# Patient Record
Sex: Male | Born: 1955 | Race: White | Hispanic: No | Marital: Married | Smoking: Never smoker
Health system: Southern US, Community
[De-identification: ages and names within clinical notes are randomized; demographics above are authoritative.]

## PROBLEM LIST (undated history)

## (undated) DIAGNOSIS — E785 Hyperlipidemia, unspecified: Secondary | ICD-10-CM

## (undated) DIAGNOSIS — I82409 Acute embolism and thrombosis of unspecified deep veins of unspecified lower extremity: Secondary | ICD-10-CM

## (undated) DIAGNOSIS — G4730 Sleep apnea, unspecified: Secondary | ICD-10-CM

## (undated) DIAGNOSIS — Z86718 Personal history of other venous thrombosis and embolism: Secondary | ICD-10-CM

## (undated) DIAGNOSIS — K219 Gastro-esophageal reflux disease without esophagitis: Secondary | ICD-10-CM

## (undated) DIAGNOSIS — M1990 Unspecified osteoarthritis, unspecified site: Secondary | ICD-10-CM

## (undated) HISTORY — DX: Gastro-esophageal reflux disease without esophagitis: K21.9

## (undated) HISTORY — DX: Personal history of other venous thrombosis and embolism: Z86.718

## (undated) HISTORY — DX: Acute embolism and thrombosis of unspecified deep veins of unspecified lower extremity: I82.409

## (undated) HISTORY — DX: Hyperlipidemia, unspecified: E78.5

## (undated) HISTORY — PX: TONSILLECTOMY: SHX5217

## (undated) HISTORY — DX: Unspecified osteoarthritis, unspecified site: M19.90

---

## 2008-02-01 HISTORY — PX: COLONOSCOPY: SHX174

## 2008-03-18 ENCOUNTER — Ambulatory Visit: Payer: Self-pay | Admitting: Gastroenterology

## 2008-03-18 LAB — HM COLONOSCOPY: HM COLON: NORMAL

## 2009-12-03 ENCOUNTER — Ambulatory Visit: Payer: Self-pay | Admitting: Family Medicine

## 2009-12-10 ENCOUNTER — Ambulatory Visit: Payer: Self-pay | Admitting: Family Medicine

## 2011-02-01 DIAGNOSIS — I82409 Acute embolism and thrombosis of unspecified deep veins of unspecified lower extremity: Secondary | ICD-10-CM

## 2011-02-01 HISTORY — DX: Acute embolism and thrombosis of unspecified deep veins of unspecified lower extremity: I82.409

## 2011-03-15 ENCOUNTER — Emergency Department: Payer: Self-pay | Admitting: Emergency Medicine

## 2011-03-15 LAB — CBC
MCH: 29.6 pg (ref 26.0–34.0)
MCV: 88 fL (ref 80–100)
Platelet: 158 10*3/uL (ref 150–440)
RDW: 12.8 % (ref 11.5–14.5)

## 2011-03-15 LAB — BASIC METABOLIC PANEL
Anion Gap: 10 (ref 7–16)
BUN: 19 mg/dL — ABNORMAL HIGH (ref 7–18)
Calcium, Total: 9 mg/dL (ref 8.5–10.1)
Creatinine: 1.05 mg/dL (ref 0.60–1.30)
EGFR (African American): >60
EGFR (Non-African Amer.): >60
Glucose: 99 mg/dL (ref 65–99)
Osmolality: 285 (ref 275–301)
Sodium: 142 mmol/L (ref 136–145)

## 2011-03-15 LAB — PROTIME-INR: Prothrombin Time: 13.4 secs (ref 11.5–14.7)

## 2011-03-21 ENCOUNTER — Ambulatory Visit: Payer: Self-pay | Admitting: Internal Medicine

## 2011-04-07 ENCOUNTER — Ambulatory Visit: Payer: Self-pay | Admitting: Internal Medicine

## 2011-04-08 LAB — PROT IMMUNOELECTROPHORES(ARMC)

## 2011-05-02 ENCOUNTER — Ambulatory Visit: Payer: Self-pay | Admitting: Internal Medicine

## 2011-06-02 ENCOUNTER — Ambulatory Visit: Payer: Self-pay | Admitting: Internal Medicine

## 2011-06-02 LAB — CBC CANCER CENTER
Eosinophil #: 0.3 x10 3/mm (ref 0.0–0.7)
HGB: 14.6 g/dL (ref 13.0–18.0)
Lymphocyte #: 1.8 x10 3/mm (ref 1.0–3.6)
Lymphocyte %: 35.4 %
MCHC: 33.3 g/dL (ref 32.0–36.0)
MCV: 87 fL (ref 80–100)
Monocyte %: 10.5 %
Neutrophil #: 2.4 x10 3/mm (ref 1.4–6.5)
RBC: 5.02 10*6/uL (ref 4.40–5.90)
RDW: 12.8 % (ref 11.5–14.5)
WBC: 5 x10 3/mm (ref 3.8–10.6)

## 2011-07-02 ENCOUNTER — Ambulatory Visit: Payer: Self-pay | Admitting: Internal Medicine

## 2012-02-01 DIAGNOSIS — Z86718 Personal history of other venous thrombosis and embolism: Secondary | ICD-10-CM

## 2012-02-01 HISTORY — DX: Personal history of other venous thrombosis and embolism: Z86.718

## 2013-03-20 IMAGING — CT CT HEAD WITHOUT CONTRAST
1 series · 16 of 30 positions shown, 20 images · non-contrast
Comparison: none

REASON FOR EXAM: HA on Coumadin      CR  [id number]
COMMENTS:

PROCEDURE:     CT  - CT HEAD WITHOUT CONTRAST  - June 02, 2011 [DATE]
RESULT:     Comparison:  None
TECHNIQUE: Multiple axial images from the foramen magnum to the vertex were
obtained without IV contrast.

[Series 2: soft tissue · axial · 0.44mm/px · z∈[+34,+184]mm · 16 of 34 slices shown, 20 images]
[im 2/34  brain]
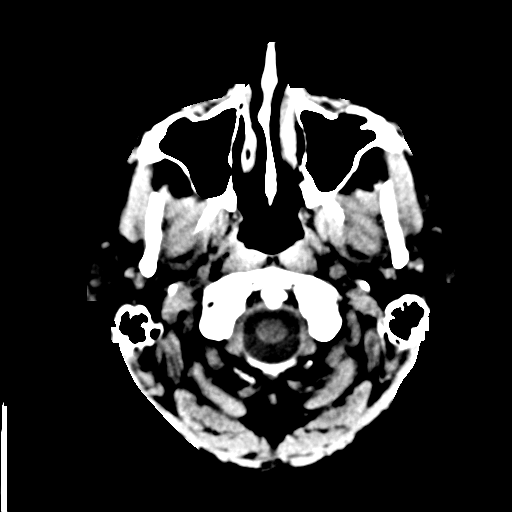
[im 2/34  bone]
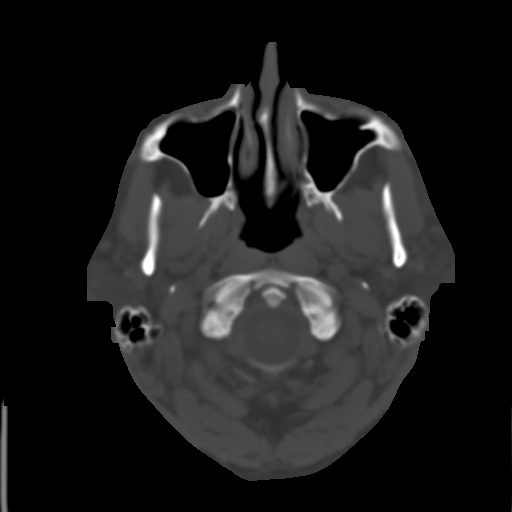
[im 4/34  brain]
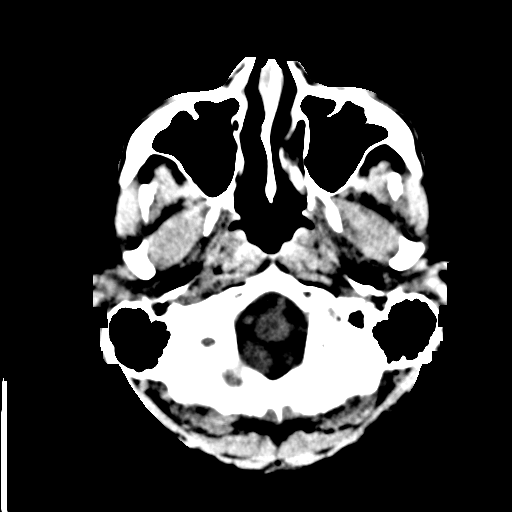
[im 6/34  brain]
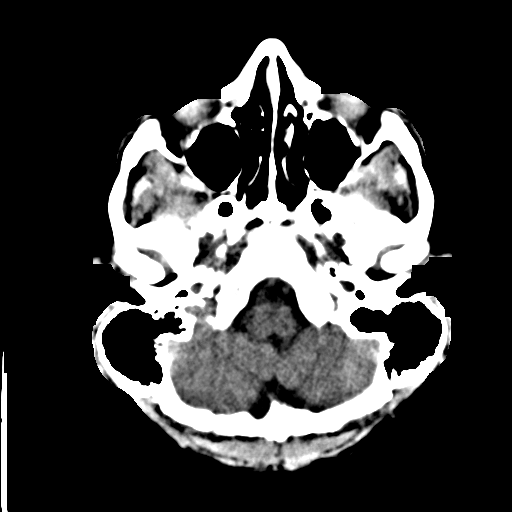
[im 8/34  brain]
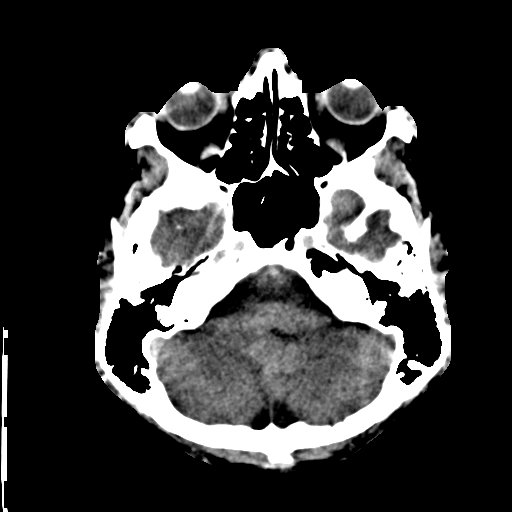
[im 10/34  brain]
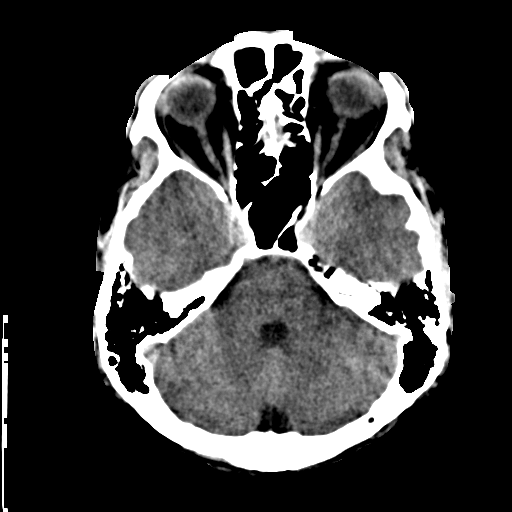
[im 10/34  bone]
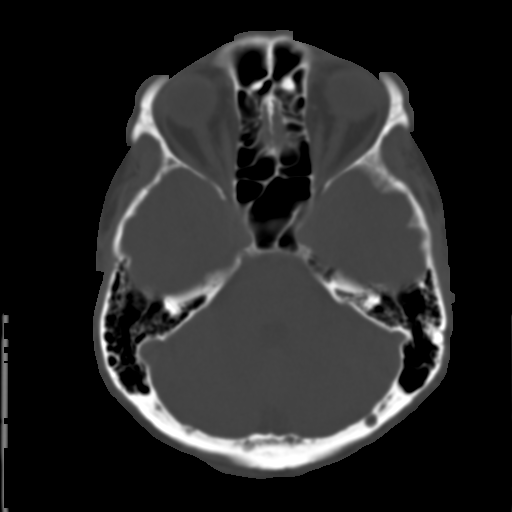
[im 12/34  brain]
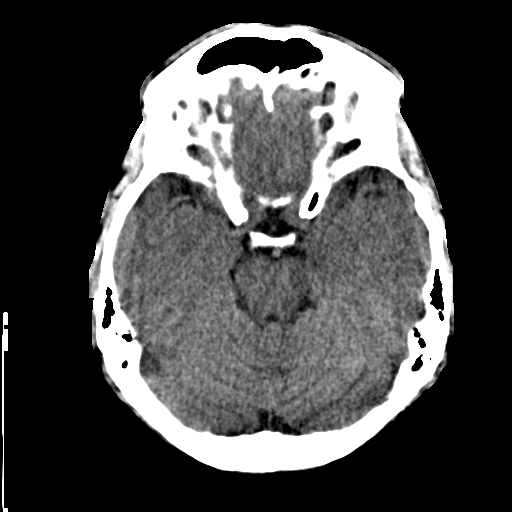
[im 14/34  brain]
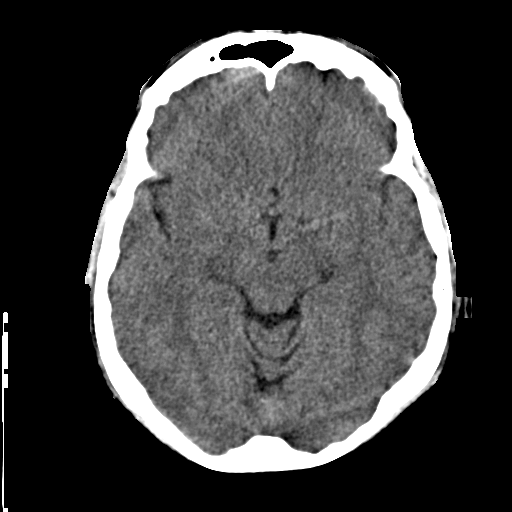
[im 16/34  brain]
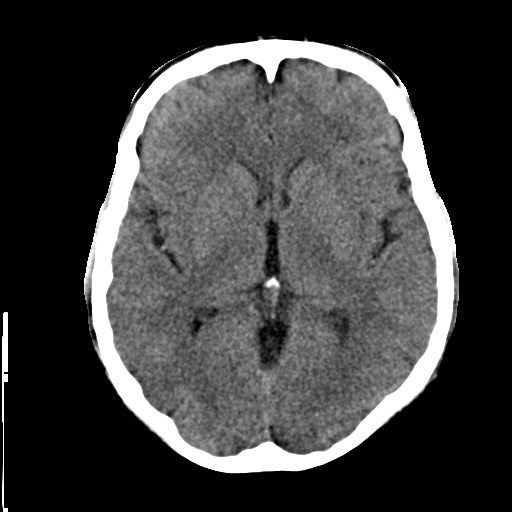
[im 18/34  brain]
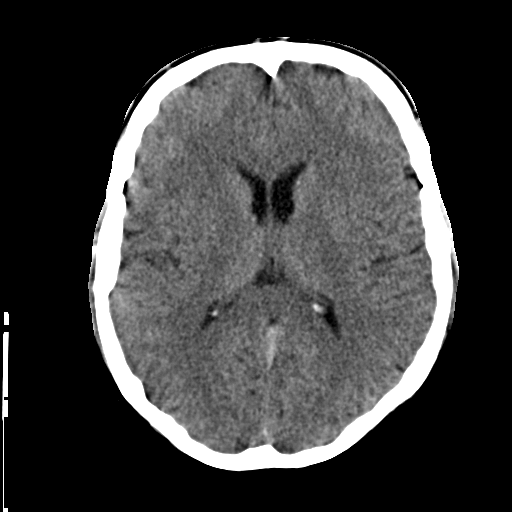
[im 18/34  bone]
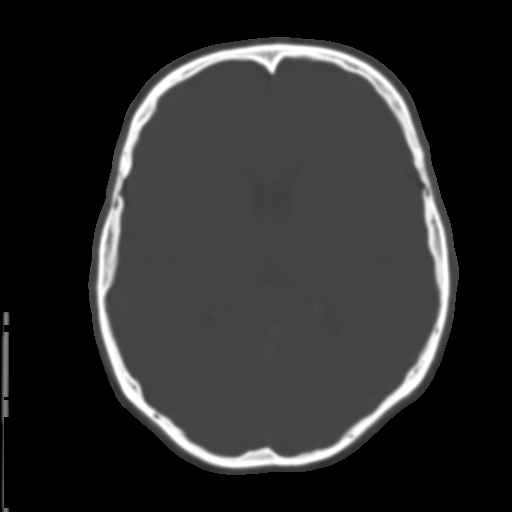
[im 20/34  brain]
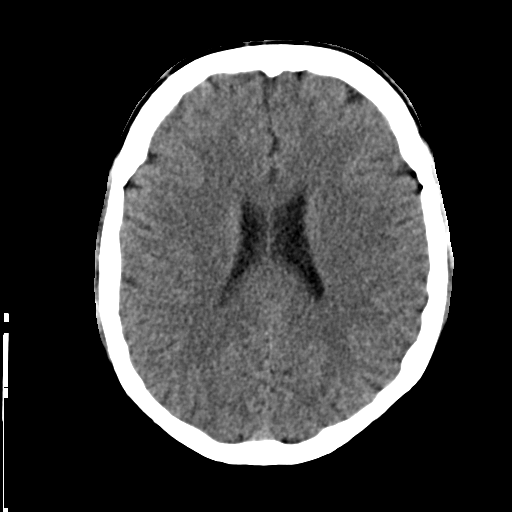
[im 22/34  brain]
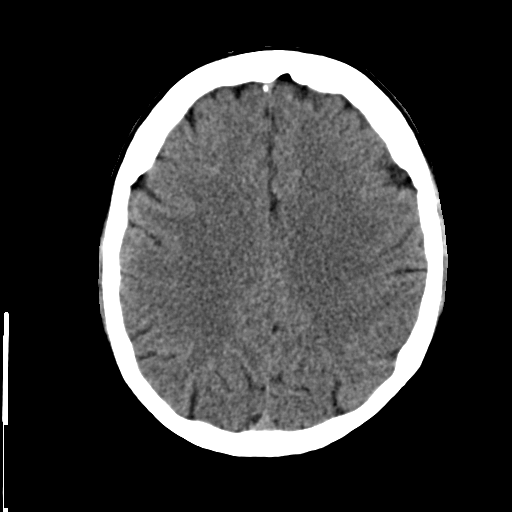
[im 24/34  brain]
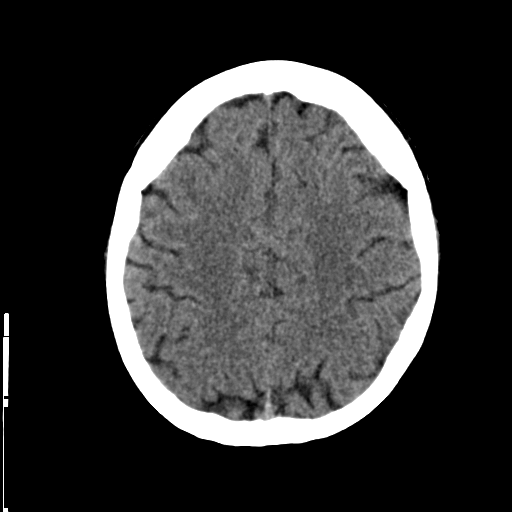
[im 26/34  brain]
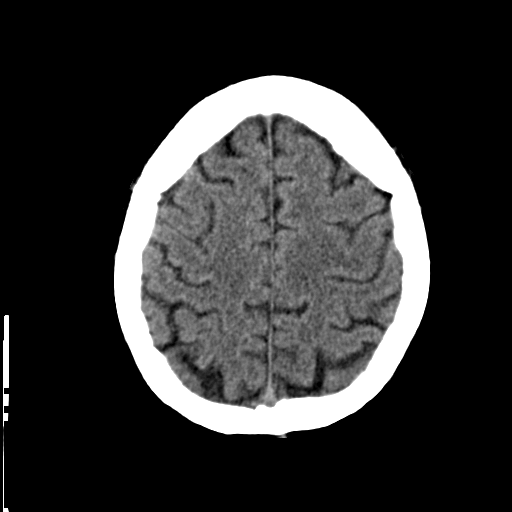
[im 26/34  bone]
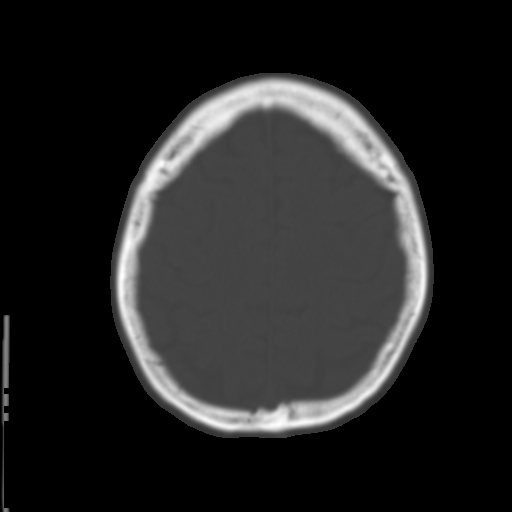
[im 28/34  brain]
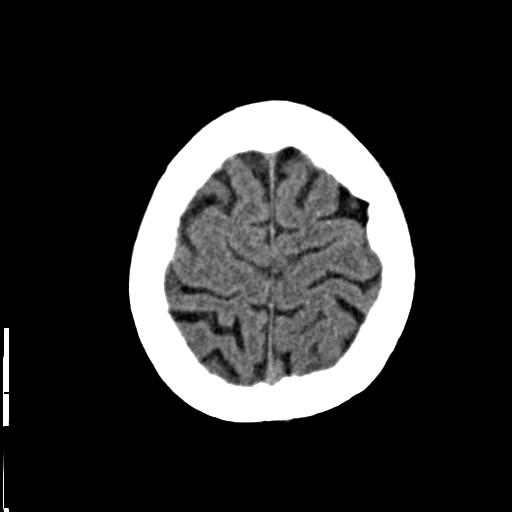
[im 30/34  brain]
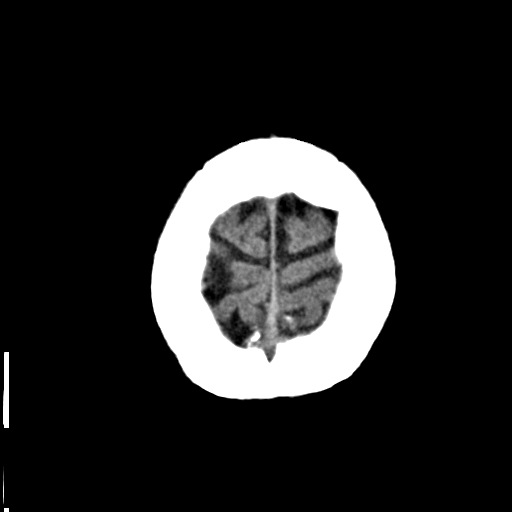
[im 32/34  brain]
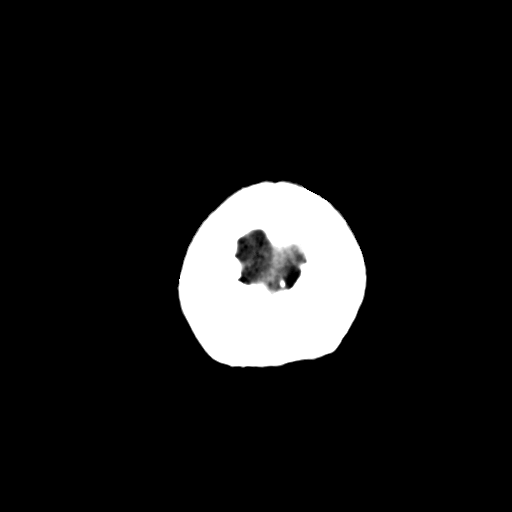

[16 of 30 positions shown; findings below may reference images not displayed]

FINDINGS: There is no evidence of mass effect, midline shift, or extra-axial fluid
collections.  There is no evidence of a space-occupying lesion or
intracranial hemorrhage. There is no evidence of a cortical-based area of
acute infarction.

The ventricles and sulci are appropriate for the patient's age. The basal
cisterns are patent.

Visualized portions of the orbits are unremarkable. The visualized portions
of the paranasal sinuses and mastoid air cells are unremarkable.

The osseous structures are unremarkable.
IMPRESSION: No acute intracranial process.

[REDACTED]

## 2013-04-29 ENCOUNTER — Ambulatory Visit: Payer: Self-pay | Admitting: Family Medicine

## 2013-09-03 ENCOUNTER — Ambulatory Visit: Payer: Self-pay | Admitting: Family Medicine

## 2013-09-03 ENCOUNTER — Inpatient Hospital Stay: Payer: Self-pay | Admitting: Internal Medicine

## 2013-09-03 LAB — BASIC METABOLIC PANEL
Anion Gap: 8 (ref 7–16)
BUN: 16 mg/dL (ref 7–18)
CALCIUM: 8.3 mg/dL — AB (ref 8.5–10.1)
Chloride: 104 mmol/L (ref 98–107)
Co2: 29 mmol/L (ref 21–32)
Creatinine: 1.07 mg/dL (ref 0.60–1.30)
EGFR (Non-African Amer.): >60
GLUCOSE: 123 mg/dL — AB (ref 65–99)
Osmolality: 284 (ref 275–301)
Potassium: 3.9 mmol/L (ref 3.5–5.1)
Sodium: 141 mmol/L (ref 136–145)

## 2013-09-03 LAB — CBC WITH DIFFERENTIAL/PLATELET
BASOS ABS: 0 10*3/uL (ref 0.0–0.1)
Basophil %: 0.3 %
EOS PCT: 3.1 %
Eosinophil #: 0.2 10*3/uL (ref 0.0–0.7)
HCT: 41.6 % (ref 40.0–52.0)
HGB: 14.1 g/dL (ref 13.0–18.0)
LYMPHS ABS: 1.6 10*3/uL (ref 1.0–3.6)
LYMPHS PCT: 25.2 %
MCH: 29.6 pg (ref 26.0–34.0)
MCHC: 33.8 g/dL (ref 32.0–36.0)
MCV: 88 fL (ref 80–100)
Monocyte #: 0.8 x10 3/mm (ref 0.2–1.0)
Monocyte %: 12.3 %
Neutrophil #: 3.8 10*3/uL (ref 1.4–6.5)
Neutrophil %: 59.1 %
Platelet: 201 10*3/uL (ref 150–440)
RBC: 4.74 10*6/uL (ref 4.40–5.90)
RDW: 13.3 % (ref 11.5–14.5)
WBC: 6.4 10*3/uL (ref 3.8–10.6)

## 2013-09-03 LAB — PROTIME-INR
INR: 2.4
PROTHROMBIN TIME: 25.3 s — AB (ref 11.5–14.7)

## 2013-09-03 LAB — APTT: ACTIVATED PTT: 55.5 s — AB (ref 23.6–35.9)

## 2013-09-04 ENCOUNTER — Ambulatory Visit: Payer: Self-pay | Admitting: Internal Medicine

## 2013-09-04 LAB — BASIC METABOLIC PANEL
Anion Gap: 7 (ref 7–16)
BUN: 14 mg/dL (ref 7–18)
CALCIUM: 8.4 mg/dL — AB (ref 8.5–10.1)
Chloride: 106 mmol/L (ref 98–107)
Co2: 27 mmol/L (ref 21–32)
Creatinine: 1.12 mg/dL (ref 0.60–1.30)
EGFR (Non-African Amer.): >60
Glucose: 92 mg/dL (ref 65–99)
OSMOLALITY: 280 (ref 275–301)
Potassium: 3.9 mmol/L (ref 3.5–5.1)
Sodium: 140 mmol/L (ref 136–145)

## 2013-09-04 LAB — CBC WITH DIFFERENTIAL/PLATELET
Basophil #: 0 10*3/uL (ref 0.0–0.1)
Basophil %: 0.6 %
Eosinophil #: 0.3 10*3/uL (ref 0.0–0.7)
Eosinophil %: 3.9 %
HCT: 41.3 % (ref 40.0–52.0)
HGB: 13.8 g/dL (ref 13.0–18.0)
LYMPHS PCT: 34.2 %
Lymphocyte #: 2.4 10*3/uL (ref 1.0–3.6)
MCH: 29.5 pg (ref 26.0–34.0)
MCHC: 33.4 g/dL (ref 32.0–36.0)
MCV: 88 fL (ref 80–100)
Monocyte #: 0.9 x10 3/mm (ref 0.2–1.0)
Monocyte %: 12.2 %
Neutrophil #: 3.5 10*3/uL (ref 1.4–6.5)
Neutrophil %: 49.1 %
Platelet: 208 10*3/uL (ref 150–440)
RBC: 4.68 10*6/uL (ref 4.40–5.90)
RDW: 13.4 % (ref 11.5–14.5)
WBC: 7.1 10*3/uL (ref 3.8–10.6)

## 2013-09-04 LAB — PROTIME-INR
INR: 2.1
PROTHROMBIN TIME: 23 s — AB (ref 11.5–14.7)

## 2013-09-04 LAB — HEPARIN LEVEL (UNFRACTIONATED): ANTI-XA(UNFRACTIONATED): 0.4 [IU]/mL (ref 0.30–0.70)

## 2013-09-18 ENCOUNTER — Ambulatory Visit: Payer: Self-pay | Admitting: Internal Medicine

## 2013-09-18 LAB — POTASSIUM: POTASSIUM: 4.2 mmol/L (ref 3.5–5.1)

## 2013-09-18 LAB — MAGNESIUM: MAGNESIUM: 2.1 mg/dL

## 2013-09-18 LAB — CALCIUM: Calcium, Total: 8.9 mg/dL (ref 8.5–10.1)

## 2013-10-01 ENCOUNTER — Ambulatory Visit: Payer: Self-pay | Admitting: Internal Medicine

## 2013-10-04 LAB — PSA: PSA: 0.7 ng/mL (ref 0.0–4.0)

## 2013-10-04 LAB — CEA: CEA: 2.1 ng/mL (ref 0.0–4.7)

## 2013-10-04 LAB — CANCER ANTIGEN 19-9: CA 19-9: 6 U/mL (ref 0–35)

## 2013-10-25 ENCOUNTER — Ambulatory Visit: Payer: Self-pay | Admitting: Family Medicine

## 2013-10-31 ENCOUNTER — Ambulatory Visit: Payer: Self-pay | Admitting: Internal Medicine

## 2013-12-18 LAB — BASIC METABOLIC PANEL
BUN: 18 mg/dL (ref 4–21)
CREATININE: 1.2 mg/dL (ref 0.6–1.3)
GLUCOSE: 96 mg/dL
POTASSIUM: 5.2 mmol/L (ref 3.4–5.3)
Sodium: 142 mmol/L (ref 137–147)

## 2013-12-18 LAB — PSA: PSA: 0.9

## 2013-12-18 LAB — HEPATIC FUNCTION PANEL
ALT: 21 U/L (ref 10–40)
AST: 9 U/L — AB (ref 14–40)

## 2013-12-18 LAB — LIPID PANEL
Cholesterol: 247 mg/dL — AB (ref 0–200)
HDL: 47 mg/dL (ref 35–70)
LDL Cholesterol: 140 mg/dL
Triglycerides: 298 mg/dL — AB (ref 40–160)

## 2013-12-18 LAB — TSH: TSH: 4.74 u[IU]/mL (ref 0.41–5.90)

## 2013-12-18 LAB — CBC AND DIFFERENTIAL
HCT: 46 % (ref 41–53)
HEMOGLOBIN: 15.5 g/dL (ref 13.5–17.5)
Platelets: 245 10*3/uL (ref 150–399)
WBC: 5.8 10*3/mL

## 2014-05-24 NOTE — H&P (Signed)
PATIENT NAME:  Terry Liu, KEMPFER MR#:  [id number] DATE OF BIRTH:  04/23/1955  DATE OF ADMISSION:  09/03/2013  PRIMARY CARE PHYSICIAN: Dr. Noberto Retort.  PRIMARY ONCOLOGIST:  Dr. Inez Pilgrim.   CHIEF COMPLAINT: Deep vein thrombosis.   HISTORY OF PRESENTING ILLNESS: A 59 year old male with history of deep vein thrombosis in the left leg who is on Coumadin since last 2 years since having deep vein thrombosis and following with Dr. Inez Pilgrim; 2 to 3 weeks ago his INR level was found to be 3.5 so his primary care doctor decreased his Coumadin from 9 mg daily to 7.5 mg daily. He called him to check his INR level which was 2.9; 2 to 3 days ago. He noticed that he has some swelling of his left calf with some redness around it for the last few days and so he talked to his primary care doctor about this. He referred him to have a Doppler study of  left lower extremity which was done today and reported to be positive for deep vein thrombosis. So, the patient's primary care physician spoke to Dr. Inez Pilgrim and they suggested that his INR level is expected, still he developed deep vein thrombosis and so which might be Coumadin failure and sent him in for direct admission to be started on hepatin IV drip and further management as per Dr. Inez Pilgrim.   REVIEW OF SYSTEMS:  CONSTITUTIONAL: Negative for fever, fatigue, weakness, pain or weight loss.  EYES: No blurring, double vision, discharge or redness.  EARS, NOSE, THROAT: No tinnitus, ear pain or hearing loss.  RESPIRATORY: No cough, wheezing, hemoptysis, or shortness of breath.  CARDIOVASCULAR: No chest pain, orthopnea, edema, arrhythmia, palpitations.  GASTROINTESTINAL: No nausea, vomiting, diarrhea, abdominal pain.  GENITOURINARY: No dysuria, hematuria, or increased frequency.  ENDOCRINE: No heat or cold intolerance. No excessive sweating.  SKIN: No acne, rashes, or lesions.  MUSCULOSKELETAL: No pain or swelling in the joints.  NEUROLOGICAL: No numbness, weakness, tremor or  vertigo.  PSYCHIATRIC: No anxiety, insomnia, bipolar disorder.   PAST MEDICAL HISTORY: Deep vein thrombosis in 2013 on left leg.   PAST SURGICAL HISTORY: None.   FAMILY HISTORY: Mother had lung and breast cancer. Father had lung cancer and blood clot in the leg. Sister had melanoma. The patient had colonoscopy done which was negative 5 years ago.   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 He denies smoking, drinking alcohol or illegal drug. Colonoscopy was negative 5 years ago and denies any recent trauma or injury to the leg.   HOME MEDICATIONS: Coumadin 7.5 mg daily.   PHYSICAL EXAMINATION:  VITAL SIGNS: Temperature 99.3, pulse 75, respirations 16, blood pressure 122/81 and pulse oximetry is 94% on room air.  GENERAL: The patient is fully alert and oriented to time, place, and person. Does not appear in any acute distress.  HEENT: Head and neck atraumatic. Conjunctivae pink. Oral mucosa moist.  NECK: Supple. No JVD.  RESPIRATORY: Bilateral equal and clear entry.  CARDIOVASCULAR: S1, S2 present, regular. No murmur.  ABDOMEN: Soft, nontender. Bowel sounds present. No organomegaly.  SKIN: No rashes.  LEGS: There is slight swelling and tenderness on the left calf. No edema present.  NEUROLOGICAL: Follows commands. No tremor or rigidity. Power 5/5 all 4 limbs.  PSYCHIATRIC: Does not appear acute psychiatric illness.  JOINTS: No swelling or tenderness.   IMPORTANT LABORATORY:  Glucose 123, BUN 16, creatinine 1.07, sodium 140 and potassium 3.9. Chloride is 104, CO2 is 29,  calcium 8.3.   WBC 6.4, hemoglobin 14.1, platelet count 201,000. MCV is 88. INR is 2.4. DVT study is done, shows popliteal vein acute on chronic thrombosis.   ASSESSMENT AND PLAN: 59 year old male with history of deep vein thrombosis was on Coumadin therapeutic dose and found having acute on chronic deep vein thrombosis on left leg.   1. Deep vein thrombosis. The patient  is already on Coumadin, INR therapeutic. Still, the patient developed deep vein thrombosis. Dr. Inez Pilgrim suggested to start on hepatin IV drip, so we will admit to floor with hepatin IV drip and further management as per Dr. Inez Pilgrim.  TOTAL TIME SPENT ON THIS ADMISSION: 45 minutes.    ____________________________ Ceasar Lund Anselm Jungling, MD [id number] D: 09/03/2013 22:09:02 ET T: 09/03/2013 22:46:46 ET JOB#: [id number]  cc: Ceasar Lund. Anselm Jungling, MD, <Dictator> Simonne Come. Inez Pilgrim, MD Vaughan Basta MD ELECTRONICALLY SIGNED 09/09/2013 8:17

## 2014-05-24 NOTE — Discharge Summary (Signed)
PATIENT NAME:  Terry Liu, Terry Liu MR#:  [id number] DATE OF BIRTH:  May 28, 1955  DATE OF ADMISSION:  09/03/2013 DATE OF DISCHARGE:  09/04/2013  DISCHARGE DIAGNOSIS:  Left popliteal vein, acute on chronic deep venous thrombosis.  IMAGING STUDIES:  Venous Doppler of lower extremities which showed acute and chronic left popliteal thrombus.   ADMITTING HISTORY AND PHYSICAL AND HOSPITAL COURSE: Please see detailed H and P dictated by Dr. Anselm Jungling. In brief, a 59 year old male patient on Coumadin with therapeutic INR, presented to the hospitalist, a direct admit from Dr. Alben Spittle office with pain of the left lower extremity. He was found to have acute on chronic left popliteal thrombus, was admitted on IV heparin after consulting with Dr. Inez Pilgrim. Later, the patient has done well. Doppler was done. He denied any shortness of breath, chest pain. No consent for PE. His Coumadin has been stopped and the patient will be started on Eliquis b.i.d. dosing and have him follow up with his primary care physician and hematology, Dr. Inez Pilgrim.   Prior to discharge, the patient has no swelling of the left lower extremity, has some mild tenderness behind his knee, but able to walk. No bleeding.   DISCHARGE MEDICATIONS: 1.  Eliquis 10 mg 2 times a day for a week followed by 5 mg 2 times a day to be started from 09/05/2013.  2.  Flonase 1 spray nasally once a day.   DISCHARGE INSTRUCTIONS: Regular diet. Activity as tolerated. Follow up with primary care physician, Dr. Inez Pilgrim in a week.    ____________________________ Leia Alf. Larry Knipp, MD srs:ds D: 09/05/2013 12:40:00 ET T: 09/05/2013 15:50:14 ET JOB#: [id number]  cc: Alveta Heimlich R. Wilbon Obenchain, MD, <Dictator> Simonne Come. Inez Pilgrim, MD Neita Carp MD ELECTRONICALLY SIGNED 09/16/2013 14:10

## 2014-09-19 ENCOUNTER — Encounter: Payer: Self-pay | Admitting: Family Medicine

## 2014-09-19 ENCOUNTER — Ambulatory Visit (INDEPENDENT_AMBULATORY_CARE_PROVIDER_SITE_OTHER): Payer: 59 | Admitting: Family Medicine

## 2014-09-19 VITALS — BP 122/84 | HR 64 | Temp 98.1°F | Resp 18 | Ht 72.0 in | Wt 286.0 lb

## 2014-09-19 DIAGNOSIS — K429 Umbilical hernia without obstruction or gangrene: Secondary | ICD-10-CM

## 2014-09-19 DIAGNOSIS — L309 Dermatitis, unspecified: Secondary | ICD-10-CM | POA: Insufficient documentation

## 2014-09-19 DIAGNOSIS — K219 Gastro-esophageal reflux disease without esophagitis: Secondary | ICD-10-CM | POA: Insufficient documentation

## 2014-09-19 DIAGNOSIS — N529 Male erectile dysfunction, unspecified: Secondary | ICD-10-CM | POA: Insufficient documentation

## 2014-09-19 DIAGNOSIS — T148XXA Other injury of unspecified body region, initial encounter: Secondary | ICD-10-CM

## 2014-09-19 DIAGNOSIS — J069 Acute upper respiratory infection, unspecified: Secondary | ICD-10-CM | POA: Diagnosis not present

## 2014-09-19 DIAGNOSIS — E785 Hyperlipidemia, unspecified: Secondary | ICD-10-CM

## 2014-09-19 DIAGNOSIS — Z86718 Personal history of other venous thrombosis and embolism: Secondary | ICD-10-CM | POA: Insufficient documentation

## 2014-09-19 DIAGNOSIS — R0683 Snoring: Secondary | ICD-10-CM | POA: Insufficient documentation

## 2014-09-19 DIAGNOSIS — Z09 Encounter for follow-up examination after completed treatment for conditions other than malignant neoplasm: Secondary | ICD-10-CM | POA: Insufficient documentation

## 2014-09-19 DIAGNOSIS — K5732 Diverticulitis of large intestine without perforation or abscess without bleeding: Secondary | ICD-10-CM

## 2014-09-19 DIAGNOSIS — K7689 Other specified diseases of liver: Secondary | ICD-10-CM

## 2014-09-19 DIAGNOSIS — I82409 Acute embolism and thrombosis of unspecified deep veins of unspecified lower extremity: Secondary | ICD-10-CM

## 2014-09-19 DIAGNOSIS — R002 Palpitations: Secondary | ICD-10-CM | POA: Insufficient documentation

## 2014-09-19 DIAGNOSIS — E669 Obesity, unspecified: Secondary | ICD-10-CM

## 2014-09-19 DIAGNOSIS — I498 Other specified cardiac arrhythmias: Secondary | ICD-10-CM

## 2014-09-19 DIAGNOSIS — I803 Phlebitis and thrombophlebitis of lower extremities, unspecified: Secondary | ICD-10-CM

## 2014-09-19 DIAGNOSIS — G4730 Sleep apnea, unspecified: Secondary | ICD-10-CM

## 2014-09-19 DIAGNOSIS — S0990XA Unspecified injury of head, initial encounter: Secondary | ICD-10-CM

## 2014-09-19 DIAGNOSIS — M1990 Unspecified osteoarthritis, unspecified site: Secondary | ICD-10-CM

## 2014-09-19 DIAGNOSIS — K648 Other hemorrhoids: Secondary | ICD-10-CM

## 2014-09-19 MED ORDER — CEFDINIR 300 MG PO CAPS: 300.0000 mg | ORAL_CAPSULE | Freq: Two times a day (BID) | ORAL | Status: DC

## 2014-09-19 NOTE — Patient Instructions (Signed)
Try Delsym and plain Mucinex. If your cough does not improve in the next week start antibiotic.

## 2014-09-19 NOTE — Progress Notes (Signed)
Subjective:     Patient ID: Terry Liu, male   DOB: [birth year], 59 y.o.   MRN: [id number]  HPI  Chief Complaint  Patient presents with  . Cough    Patient comes in office today with concerns of cough/congestion and sinus pain for the past 2 weeks. Patient reports thatt he has a lot of chest congestion and that his cough has become more productive. Patient reports taking Mucinex Dm, Sudafed and Tylenol  Wife has been sick as well. Reports purulent PND with accompanying cough. Denies significant sinus pressure but uses Afrin twice daily chronically.   Review of Systems  Constitutional: Positive for fever (transient ot 100 degrees). Negative for chills.       Objective:   Physical Exam  Constitutional: He appears well-developed and well-nourished. No distress (frequent barky cough).  Ears: Left T.M intact without inflammation, right obscured by cerumen Sinuses: non-tender Throat: no tonsillar enlargement or exudate Neck: no cervical adenopathy Lungs: clear     Assessment:    1. Upper respiratory infection - cefdinir (OMNICEF) 300 MG capsule; Take 1 capsule (300 mg total) by mouth 2 (two) times daily.  Dispense: 20 capsule; Refill: 0    Plan:   Add Delsym and Mucinex-defers narcotic cough syrup. If cough or drainage not clearing over the next week to start abx. Suggested steroid nasal spray in the future to get off decongestant spray.

## 2014-10-29 ENCOUNTER — Other Ambulatory Visit: Payer: Self-pay | Admitting: Family Medicine

## 2014-11-07 ENCOUNTER — Ambulatory Visit (INDEPENDENT_AMBULATORY_CARE_PROVIDER_SITE_OTHER): Payer: 59 | Admitting: Family Medicine

## 2014-11-07 ENCOUNTER — Encounter: Payer: Self-pay | Admitting: Family Medicine

## 2014-11-07 VITALS — BP 122/96 | HR 72 | Temp 97.6°F | Resp 18 | Wt 286.2 lb

## 2014-11-07 DIAGNOSIS — Z23 Encounter for immunization: Secondary | ICD-10-CM | POA: Diagnosis not present

## 2014-11-07 DIAGNOSIS — N644 Mastodynia: Secondary | ICD-10-CM | POA: Diagnosis not present

## 2014-11-07 NOTE — Progress Notes (Signed)
Patient ID: OCEAN SCHILDT, male   DOB: [birth year], 59 y.o.   MRN: [id number] Name: Terry Liu   MRN: [id number]    DOB: Dec 29, 1955   Date:11/07/2014       Progress Note  Subjective  Chief Complaint  Chief Complaint  Patient presents with  . Breast Mass    right breast X 2 weeks    HPI  This 59 year old male has noticed a tenderness behind the right nipple to deep palpation and questions if there is a mass present there. No nipple discharge or extra pain with movement of arms. No palpitations, cough, wheeze or dyspnea. Discomfort started a couple weeks ago as he was moving boxes at home.    Past Medical History  Diagnosis Date  . GERD (gastroesophageal reflux disease)   . Arthritis     Osteoarthritis  . Hyperlipidemia    Past Surgical History  Procedure Laterality Date  . Tonsillectomy     Family History  Problem Relation Age of Onset  . Melanoma Sister     benign breast tumor  . Diabetes Sister    Social History  Substance Use Topics  . Smoking status: Never Smoker   . Smokeless tobacco: Never Used  . Alcohol Use: No    Current outpatient prescriptions:  .  ELIQUIS 5 MG TABS tablet, TAKE 1 TABLET BY MOUTH TWICE DAILY, Disp: 60 tablet, Rfl: 0  No Known Allergies  Review of Systems  Constitutional: Negative.   HENT: Negative.   Eyes: Negative.   Respiratory: Negative.   Cardiovascular: Negative.   Gastrointestinal: Negative.   Genitourinary: Negative.   Musculoskeletal: Negative.   Skin: Negative.   Neurological: Negative.   Endo/Heme/Allergies: Negative.   Psychiatric/Behavioral: Negative.    Objective  Filed Vitals:   11/07/14 1331  BP: 122/96  Pulse: 72  Temp: 97.6 F (36.4 C)  TempSrc: Oral  Resp: 18  Weight: 286 lb 3.2 oz (129.819 kg)   Physical Exam  Constitutional: He is oriented to person, place, and time and well-developed, well-nourished, and in no distress.  HENT:  Head: Normocephalic and atraumatic.  Neck: Normal range of motion.  Neck supple. No thyromegaly present.  Cardiovascular: Normal rate and regular rhythm.   Pulmonary/Chest: Effort normal and breath sounds normal. He exhibits tenderness.  Tender to palpate right nipple. No specific mass identified. No local lymphadenopathy.  Lymphadenopathy:    He has no cervical adenopathy.  Neurological: He is alert and oriented to person, place, and time.  Skin: No rash noted.  Psychiatric: Affect and judgment normal.    Assessment & Plan  1. Mastalgia Onset over the past couple weeks. Started as he was moving boxes around at home. No specific injury remembered. Tender behind the right nipple. No specific masses identified. No nipple discharge or local adenopathy. May apply moist heat and recheck as needed. If persistent or mass develops, will need mammograms and possible surgical referral. Monitor for changes.  2. Need for influenza vaccination - Flu Vaccine QUAD 36+ mos PF IM (Fluarix & Fluzone Quad PF)

## 2014-12-03 ENCOUNTER — Other Ambulatory Visit: Payer: Self-pay | Admitting: Family Medicine

## 2014-12-16 ENCOUNTER — Ambulatory Visit (INDEPENDENT_AMBULATORY_CARE_PROVIDER_SITE_OTHER): Payer: 59 | Admitting: Family Medicine

## 2014-12-16 ENCOUNTER — Encounter: Payer: Self-pay | Admitting: Family Medicine

## 2014-12-16 ENCOUNTER — Encounter: Payer: Self-pay | Admitting: *Deleted

## 2014-12-16 VITALS — BP 122/84 | HR 80 | Temp 98.2°F | Resp 16 | Wt 289.0 lb

## 2014-12-16 DIAGNOSIS — N61 Mastitis without abscess: Secondary | ICD-10-CM | POA: Diagnosis not present

## 2014-12-16 DIAGNOSIS — I82409 Acute embolism and thrombosis of unspecified deep veins of unspecified lower extremity: Secondary | ICD-10-CM | POA: Diagnosis not present

## 2014-12-16 DIAGNOSIS — N644 Mastodynia: Secondary | ICD-10-CM | POA: Diagnosis not present

## 2014-12-16 MED ORDER — AMOXICILLIN 500 MG PO CAPS: 500.0000 mg | ORAL_CAPSULE | Freq: Three times a day (TID) | ORAL | Status: DC

## 2014-12-16 NOTE — Progress Notes (Signed)
Patient ID: GARREN OBA, male   DOB: 1955/03/06, 59 y.o.   MRN: [id number]       Patient: Terry Liu Male    DOB: January 03, 1956   59 y.o.   MRN: [id number] Visit Date: 12/16/2014  Today's Provider: Wilhemena Durie, MD   Chief Complaint  Patient presents with  . Breast Pain    1 month F/U.    Subjective:    HPI Pain in Right Breast Patient was seen in the office a little over 1 month ago c/o pain in the right side of chest. Patient was recommended to apply moist heat over the area that was tender. He reports that he now notices a lump or cyst in his right breast. Patient reports that he has not taken anything for the pain/tenderness that he has had. Patient is concerned because the pain has lasted this long.     No Known Allergies Previous Medications   ELIQUIS 5 MG TABS TABLET    TAKE 1 TABLET BY MOUTH TWICE DAILY    Review of Systems  Constitutional: Negative.   Cardiovascular: Negative.   Musculoskeletal: Positive for myalgias.  Skin: Negative.   Neurological: Negative.   Hematological: Negative.     Social History  Substance Use Topics  . Smoking status: Never Smoker   . Smokeless tobacco: Never Used  . Alcohol Use: No   Objective:   BP 122/84 mmHg  Pulse 80  Temp(Src) 98.2 F (36.8 C)  Resp 16  Wt 289 lb (131.09 kg)  Physical Exam      Assessment & Plan:     1. Mastitis  - amoxicillin (AMOXIL) 500 MG capsule; Take 1 capsule (500 mg total) by mouth 3 (three) times daily.  Dispense: 30 capsule; Refill: 0 - Ambulatory referral to General Surgery  2. Mastalgia  - Ambulatory referral to General Surgery  3. Recurrent deep vein thrombosis (DVT) ([place]) - Ambulatory referral to Hematology     I have done the exam and reviewed the above chart and it is accurate to the best of my knowledge.   Leona Pressly Cranford Mon, MD  [facility]

## 2014-12-23 ENCOUNTER — Ambulatory Visit (INDEPENDENT_AMBULATORY_CARE_PROVIDER_SITE_OTHER): Payer: 59 | Admitting: Family Medicine

## 2014-12-23 ENCOUNTER — Encounter: Payer: Self-pay | Admitting: Family Medicine

## 2014-12-23 VITALS — BP 120/76 | HR 68 | Temp 97.7°F | Resp 16 | Ht 72.0 in | Wt 287.0 lb

## 2014-12-23 DIAGNOSIS — Z0000 Encounter for general adult medical examination without abnormal findings: Secondary | ICD-10-CM | POA: Diagnosis not present

## 2014-12-23 DIAGNOSIS — Z1211 Encounter for screening for malignant neoplasm of colon: Secondary | ICD-10-CM

## 2014-12-23 DIAGNOSIS — Z125 Encounter for screening for malignant neoplasm of prostate: Secondary | ICD-10-CM | POA: Diagnosis not present

## 2014-12-23 LAB — POCT URINALYSIS DIPSTICK
Bilirubin, UA: NEGATIVE
Glucose, UA: NEGATIVE
Ketones, UA: NEGATIVE
LEUKOCYTES UA: NEGATIVE
Nitrite, UA: NEGATIVE
PH UA: 5
PROTEIN UA: NEGATIVE
RBC UA: NEGATIVE
SPEC GRAV UA: 1.02
UROBILINOGEN UA: 0.2

## 2014-12-23 LAB — IFOBT (OCCULT BLOOD): IFOBT: POSITIVE

## 2014-12-23 NOTE — Progress Notes (Signed)
Patient ID: KOBYN MEDAL, male   DOB: 1956-01-20, 59 y.o.   MRN: [id number]       Patient: Terry Liu, Male    DOB: [birth year], 59 y.o.   MRN: [id number] Visit Date: 12/23/2014  Today's Provider: Wilhemena Durie, MD   Chief Complaint  Patient presents with  . Annual Exam   Subjective:    Annual physical exam Terry Liu is a 59 y.o. male who presents today for health maintenance and complete physical. He feels fairly well. He has a cold that started yesterday.  He reports he does not do any formal exercising, but he is very active. He reports he is sleeping poorly.  ----------------------------------------------------------------- Colonoscopy- 03/18/08 normal   Review of Systems  Constitutional: Positive for fatigue.  HENT: Negative.   Eyes: Negative.  Visual disturbance: at night per wife.  Respiratory: Positive for apnea.   Cardiovascular: Negative.   Gastrointestinal: Negative.   Endocrine: Negative.   Genitourinary: Negative.   Musculoskeletal: Positive for back pain and arthralgias.  Skin: Negative.        rash  Allergic/Immunologic: Negative.   Neurological: Negative.   Hematological: Negative.   Psychiatric/Behavioral: Negative.     Social History He  reports that he has never smoked. He has never used smokeless tobacco. He reports that he does not drink alcohol or use illicit drugs. Social History   Social History  . Marital Status: Married    Spouse Name: N/A  . Number of Children: N/A  . Years of Education: N/A   Social History Main Topics  . Smoking status: Never Smoker   . Smokeless tobacco: Never Used  . Alcohol Use: No  . Drug Use: No  . Sexual Activity: Not Asked   Other Topics Concern  . None   Social History Narrative    Patient Active Problem List   Diagnosis Date Noted  . Closed head injury 09/19/2014  . GERD (gastroesophageal reflux disease) 09/19/2014  . ED (erectile dysfunction) 09/19/2014  . Obesity 09/19/2014  .  Umbilical hernia without obstruction or gangrene 09/19/2014  . Hemorrhoids, internal 09/19/2014  . 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 Diverticulitis large intestine w/o perforation or abscess w/o bleeding 09/19/2014  . Fluttering heart ([place]) 09/19/2014  . Thrombophlebitis leg 09/19/2014  . Hematoma 09/19/2014  . Osteoarthritis 09/19/2014  . Hyperlipidemia 09/19/2014  . DVT (deep venous thrombosis) ([place]) 09/19/2014  . Sleep apnea 09/19/2014  . Snoring 09/19/2014  . Dermatitis, eczematoid 09/19/2014    Past Surgical History  Procedure Laterality Date  . Tonsillectomy      Family History  Family Status  Relation Status Death Age  . Mother Deceased 13    breast and lung cancer  . Father Deceased 24    coronary Artery disease, Nicotine Dependence, Mi's x4 with first being at age 60.  [name] Brother Alive   . Paternal Uncle Alive     Myocardial Infarction at an early age  . Sister Alive     Possible Melanoma, Gastric bypass   His family history includes Diabetes in his sister; Melanoma in his sister.    No Known Allergies  Previous Medications   AMOXICILLIN (AMOXIL) 500 MG CAPSULE    Take 1 capsule (500 mg total) by mouth 3 (three) times daily.   ELIQUIS 5 MG TABS TABLET    TAKE 1 TABLET BY MOUTH TWICE DAILY    Patient Care Team: [name], MD as PCP - General (Family Medicine)  Objective:   Vitals: BP 120/76 mmHg  Pulse 68  Temp(Src) 97.7 F (36.5 C) (Oral)  Resp 16  Ht 6' (1.829 m)  Wt 287 lb (130.182 kg)  BMI 38.92 kg/m2  SpO2 97%   Physical Exam  Constitutional: He is oriented to person, place, and time. He appears well-developed and well-nourished.  Obese WM NAD.  HENT:  Head: Normocephalic and atraumatic.  Right Ear: External ear normal.  Left Ear: External ear normal.  Nose: Nose normal.  Mouth/Throat: Oropharynx is clear and moist.  Eyes: Conjunctivae and EOM are normal. Pupils are equal, round, and reactive to light.  Neck: Normal range of  motion. Neck supple.  Cardiovascular: Normal rate, regular rhythm, normal heart sounds and intact distal pulses.   Pulmonary/Chest: Effort normal and breath sounds normal.  Abdominal: Soft. Bowel sounds are normal.  Genitourinary: Rectum normal, prostate normal and penis normal.  Musculoskeletal: Normal range of motion. He exhibits edema.  1+ edema of LE.  Neurological: He is alert and oriented to person, place, and time. He has normal reflexes.  Skin: Skin is warm and dry.  Psychiatric: He has a normal mood and affect. His behavior is normal. Judgment and thought content normal.     Depression Screen PHQ 2/9 Scores 12/23/2014  PHQ - 2 Score 0      Assessment & Plan:     Routine Health Maintenance and Physical Exam  Exercise Activities and Dietary recommendations Goals    None      Immunization History  Administered Date(s) Administered  . Influenza,inj,Quad PF,36+ Mos 11/07/2014  . Tdap 12/06/2011    Health Maintenance  Topic Date Due  . Hepatitis C Screening  [birth year]  . HIV Screening  02/15/1969  . ZOSTAVAX  02/15/2014  . INFLUENZA VACCINE  09/01/2015  . COLONOSCOPY  03/18/2018  . TETANUS/TDAP  12/05/2021      Discussed health benefits of physical activity, and encouraged him to engage in regular exercise appropriate for his age and condition.    --------------------------------------------------------------------

## 2014-12-24 ENCOUNTER — Telehealth: Payer: Self-pay

## 2014-12-24 ENCOUNTER — Ambulatory Visit: Payer: Self-pay

## 2014-12-24 LAB — CBC WITH DIFFERENTIAL/PLATELET
BASOS ABS: 0 10*3/uL (ref 0.0–0.2)
Basos: 0 %
EOS (ABSOLUTE): 0.2 10*3/uL (ref 0.0–0.4)
Eos: 4 %
HEMOGLOBIN: 14.7 g/dL (ref 12.6–17.7)
Hematocrit: 42.5 % (ref 37.5–51.0)
IMMATURE GRANS (ABS): 0 10*3/uL (ref 0.0–0.1)
IMMATURE GRANULOCYTES: 1 %
LYMPHS: 34 %
Lymphocytes Absolute: 1.7 10*3/uL (ref 0.7–3.1)
MCH: 29.9 pg (ref 26.6–33.0)
MCHC: 34.6 g/dL (ref 31.5–35.7)
MCV: 86 fL (ref 79–97)
MONOCYTES: 10 %
Monocytes Absolute: 0.5 10*3/uL (ref 0.1–0.9)
NEUTROS ABS: 2.6 10*3/uL (ref 1.4–7.0)
NEUTROS PCT: 51 %
PLATELETS: 247 10*3/uL (ref 150–379)
RBC: 4.92 x10E6/uL (ref 4.14–5.80)
RDW: 13.1 % (ref 12.3–15.4)
WBC: 5 10*3/uL (ref 3.4–10.8)

## 2014-12-24 LAB — COMPREHENSIVE METABOLIC PANEL
ALBUMIN: 4.5 g/dL (ref 3.6–4.8)
ALT: 26 IU/L (ref 0–44)
AST: 12 IU/L (ref 0–40)
Albumin/Globulin Ratio: 1.8 (ref 1.1–2.5)
Alkaline Phosphatase: 75 IU/L (ref 39–117)
BILIRUBIN TOTAL: 0.6 mg/dL (ref 0.0–1.2)
BUN / CREAT RATIO: 15 (ref 10–22)
BUN: 16 mg/dL (ref 8–27)
CALCIUM: 9.5 mg/dL (ref 8.6–10.2)
CHLORIDE: 99 mmol/L (ref 97–106)
CO2: 25 mmol/L (ref 18–29)
CREATININE: 1.1 mg/dL (ref 0.76–1.27)
GFR calc non Af Amer: 73 mL/min/{1.73_m2} (ref >59)
GFR, EST AFRICAN AMERICAN: 84 mL/min/{1.73_m2} (ref >59)
GLUCOSE: 89 mg/dL (ref 65–99)
Globulin, Total: 2.5 g/dL (ref 1.5–4.5)
Potassium: 4.8 mmol/L (ref 3.5–5.2)
Sodium: 139 mmol/L (ref 136–144)
TOTAL PROTEIN: 7 g/dL (ref 6.0–8.5)

## 2014-12-24 LAB — LIPID PANEL WITH LDL/HDL RATIO
Cholesterol, Total: 228 mg/dL — ABNORMAL HIGH (ref 100–199)
HDL: 44 mg/dL (ref >39)
LDL CALC: 124 mg/dL — AB (ref 0–99)
LDL/HDL RATIO: 2.8 ratio (ref 0.0–3.6)
TRIGLYCERIDES: 301 mg/dL — AB (ref 0–149)
VLDL Cholesterol Cal: 60 mg/dL — ABNORMAL HIGH (ref 5–40)

## 2014-12-24 LAB — TSH: TSH: 3.53 u[IU]/mL (ref 0.450–4.500)

## 2014-12-24 LAB — PSA: PROSTATE SPECIFIC AG, SERUM: 0.7 ng/mL (ref 0.0–4.0)

## 2014-12-24 NOTE — Telephone Encounter (Signed)
LMTCB ED 

## 2014-12-24 NOTE — Telephone Encounter (Signed)
-----   Message from [name], MD sent at 12/24/2014 11:12 AM EST ----- Labs okay. Diet and exercise for lipids.

## 2014-12-26 NOTE — Telephone Encounter (Signed)
Pt advised-aa 

## 2014-12-31 ENCOUNTER — Encounter: Payer: Self-pay | Admitting: General Surgery

## 2014-12-31 ENCOUNTER — Other Ambulatory Visit: Payer: 59

## 2014-12-31 ENCOUNTER — Ambulatory Visit (INDEPENDENT_AMBULATORY_CARE_PROVIDER_SITE_OTHER): Payer: 59 | Admitting: General Surgery

## 2014-12-31 VITALS — BP 130/70 | HR 76 | Resp 14 | Ht 72.0 in | Wt 286.0 lb

## 2014-12-31 DIAGNOSIS — N62 Hypertrophy of breast: Secondary | ICD-10-CM | POA: Diagnosis not present

## 2014-12-31 DIAGNOSIS — N63 Unspecified lump in unspecified breast: Secondary | ICD-10-CM

## 2014-12-31 NOTE — Progress Notes (Signed)
Patient ID: Terry Liu, male   DOB: February 21, 1955, 59 y.o.   MRN: [id number]  Chief Complaint  Patient presents with  . Other    right breast mastitis    HPI Terry Liu is a 59 y.o. male. Here for right breast evaluation.  He states he felt a thick area right breast about 2 months ago. He had given someone a hug and he felt a pain in that area. Denies redness or drainage. He did complete a round of antibiotics with Dr Rosanna Randy last dose this morning. He states there is no change after the antibiotics. He does staes that the left breast is a little tender over the past couple weeks. He had previously been moving to a new home on [address] and during this time was lifting furniture when he first noticed the breast tenderness.  No isolated history of breast trauma.  No recent medication change.  Source of lower extremity DVT unclear after hematology workup. He did develop a second clot while on Coumadin and subsequently was placed on Eliquis one year ago.  I personally reviewed the patient's history.  HPI  Past Medical History  Diagnosis Date  . GERD (gastroesophageal reflux disease)   . Arthritis     Osteoarthritis  . Hyperlipidemia   . DVT of leg (deep venous thrombosis) ([place]) 2013    LLE/Dr Cynda Acres    Past Surgical History  Procedure Laterality Date  . Tonsillectomy      Family History  Problem Relation Age of Onset  . Melanoma Sister     benign breast tumor  . Diabetes Sister   . Lung cancer Father   . Lung cancer Mother   . Breast cancer Mother 45    Social History Social History  Substance Use Topics  . Smoking status: Never Smoker   . Smokeless tobacco: Never Used  . Alcohol Use: No    No Known Allergies  Current Outpatient Prescriptions  Medication Sig Dispense Refill  . Cholecalciferol (VITAMIN D-3) 1000 UNITS CAPS Take by mouth daily.    . Probiotic Product (PROBIOTIC FORMULA) CAPS Take by mouth daily.    [name] ELIQUIS 5 MG TABS tablet TAKE 1 TABLET  BY MOUTH TWICE DAILY 60 tablet 0   No current facility-administered medications for this visit.    Review of Systems Review of Systems  Constitutional: Negative.   Respiratory: Negative.   Cardiovascular: Negative.     Blood pressure 130/70, pulse 76, resp. rate 14, height 6' (1.829 m), weight 286 lb (129.729 kg).  Physical Exam Physical Exam  Constitutional: He is oriented to person, place, and time. He appears well-developed and well-nourished.  HENT:  Mouth/Throat: Oropharynx is clear and moist.  Eyes: Conjunctivae are normal. No scleral icterus.  Neck: Neck supple.  Cardiovascular: Normal rate, regular rhythm and normal heart sounds.   Pulmonary/Chest: Effort normal and breath sounds normal. Right breast exhibits no inverted nipple, no mass, no nipple discharge, no skin change and no tenderness. Left breast exhibits no inverted nipple, no mass, no nipple discharge, no skin change and no tenderness.    2.5 cm thickening behind right nipple.  Abdominal: Soft. Normal appearance. A hernia is present.  1.5 cm facial defect umbilical area.  Lymphadenopathy:    He has no cervical adenopathy.    He has no axillary adenopathy.  Neurological: He is alert and oriented to person, place, and time.  Skin: Skin is warm and dry.  Psychiatric: His behavior is normal.  Data Reviewed Ultrasound examination of the retroareolar area was completed. On the left side the breast disc measures 1.3 x 1.8 x 2.47 m. No increased vascular flow was noted. In the left breast this area measures 0.7 x 1.32 x 1.41 cm. BIRAD-3.  Assessment    Gynecomastia, likely secondary to trauma during physical activity of moving as well as likely increased estrogen levels due to obesity.    Plan    Even with his mother's history of breast cancer, his clinical exam is not suspicious enough to warrant biopsy at this time. If the area increases in size, early reassessment and biopsy would be appropriate. We'll  otherwise plan for repeat assessment in 3 months.    Follow up in 3 months.   PCP:  Philemon Kingdom 12/31/2014, 2:57 PM

## 2014-12-31 NOTE — Patient Instructions (Signed)
The patient is aware to call back for any questions or concerns.  

## 2015-01-05 ENCOUNTER — Inpatient Hospital Stay: Payer: 59 | Admitting: Oncology

## 2015-01-07 ENCOUNTER — Other Ambulatory Visit: Payer: Self-pay | Admitting: Family Medicine

## 2015-02-04 ENCOUNTER — Inpatient Hospital Stay: Payer: BLUE CROSS/BLUE SHIELD | Attending: Oncology | Admitting: Oncology

## 2015-02-04 VITALS — BP 120/80 | HR 79 | Temp 98.1°F | Resp 18 | Ht 72.44 in | Wt 288.1 lb

## 2015-02-04 DIAGNOSIS — K219 Gastro-esophageal reflux disease without esophagitis: Secondary | ICD-10-CM | POA: Diagnosis not present

## 2015-02-04 DIAGNOSIS — E785 Hyperlipidemia, unspecified: Secondary | ICD-10-CM | POA: Diagnosis not present

## 2015-02-04 DIAGNOSIS — Z86718 Personal history of other venous thrombosis and embolism: Secondary | ICD-10-CM | POA: Diagnosis not present

## 2015-02-04 DIAGNOSIS — I824Y9 Acute embolism and thrombosis of unspecified deep veins of unspecified proximal lower extremity: Secondary | ICD-10-CM

## 2015-02-04 DIAGNOSIS — Z79899 Other long term (current) drug therapy: Secondary | ICD-10-CM | POA: Diagnosis not present

## 2015-02-04 DIAGNOSIS — M1990 Unspecified osteoarthritis, unspecified site: Secondary | ICD-10-CM | POA: Diagnosis not present

## 2015-02-04 DIAGNOSIS — Z7901 Long term (current) use of anticoagulants: Secondary | ICD-10-CM | POA: Insufficient documentation

## 2015-02-04 NOTE — Progress Notes (Signed)
Patient was being followed by Dr. Inez Pilgrim for history of DVT and was last seen in clinic in 10/2013.  While talking with patient he mentions that he had a right breast lump that was evaluated by his PCP and Dr. Bary Castilla who performed an u/s.  Dr. Bary Castilla told patient they would do another u/s in 3 months but the patient has now developed a lump on his left breast and the right breast has become painful.

## 2015-02-04 NOTE — Progress Notes (Signed)
Terry Liu  Telephone:(336) [phone] Fax:(336) [phone]  ID: Terry Liu OB: Dec 07, 1955  MR#: [id number]  [id number]  Patient Care Team: [name], MD as PCP - General (Family Medicine) [name], MD (Family Medicine) Robert Bellow, MD (General Surgery)  CHIEF COMPLAINT:  Chief Complaint  Patient presents with  . history of DVT    INTERVAL HISTORY:  Patient last evaluated in clinic in August 2015 by Dr. Inez Pilgrim.  He has been referred back for further evaluation. He currently feels well and is asymptomatic. He is tolerating Eliquis well without significant side effects. He denies any easy bleeding or bruising. He has no neurologic complaints. He denies any recent fevers or illnesses. He has no chest pain or shortness of breath. He denies any nausea, vomiting, constipation, or diarrhea. He has no urinary complaints. Patient feels at his baseline and offers no specific complaints today.  REVIEW OF SYSTEMS:   Review of Systems  Constitutional: Negative for fever and malaise/fatigue.  Respiratory: Negative.  Negative for shortness of breath.   Cardiovascular: Negative.  Negative for chest pain.  Gastrointestinal: Negative.  Negative for blood in stool and melena.  Genitourinary: Negative for hematuria.  Musculoskeletal: Negative.   Neurological: Negative.  Negative for weakness.  Endo/Heme/Allergies: Does not bruise/bleed easily.    As per HPI. Otherwise, a complete review of systems is negatve.  PAST MEDICAL HISTORY: Past Medical History  Diagnosis Date  . GERD (gastroesophageal reflux disease)   . Arthritis     Osteoarthritis  . Hyperlipidemia   . DVT of leg (deep venous thrombosis) ([place]) 2013    LLE/Dr Cynda Acres    PAST SURGICAL HISTORY: Past Surgical History  Procedure Laterality Date  . Tonsillectomy      FAMILY HISTORY Family History  Problem Relation Age of Onset  . Melanoma Sister     benign breast tumor  .  Diabetes Sister   . Lung cancer Father   . Lung cancer Mother   . Breast cancer Mother 59       ADVANCED DIRECTIVES:    HEALTH MAINTENANCE: Social History  Substance Use Topics  . Smoking status: Never Smoker   . Smokeless tobacco: Never Used  . Alcohol Use: No     Colonoscopy:  PAP:  Bone density:  Lipid panel:  No Known Allergies  Current Outpatient Prescriptions  Medication Sig Dispense Refill  . Cholecalciferol (VITAMIN D-3) 1000 UNITS CAPS Take by mouth daily.    [name] ELIQUIS 5 MG TABS tablet TAKE 1 TABLET BY MOUTH TWICE DAILY 60 tablet 0  . Probiotic Product (PROBIOTIC FORMULA) CAPS Take by mouth daily.     No current facility-administered medications for this visit.    OBJECTIVE: Filed Vitals:   02/04/15 1112  BP: 120/80  Pulse: 79  Temp: 98.1 F (36.7 C)  Resp: 18     Body mass index is 38.6 kg/(m^2).    ECOG FS:0 - Asymptomatic  General: Well-developed, well-nourished, no acute distress. Eyes: Pink conjunctiva, anicteric sclera. HEENT: Normocephalic, moist mucous membranes, clear oropharnyx. Lungs: Clear to auscultation bilaterally. Heart: Regular rate and rhythm. No rubs, murmurs, or gallops. Abdomen: Soft, nontender, nondistended. No organomegaly noted, normoactive bowel sounds. Musculoskeletal: No edema, cyanosis, or clubbing. Neuro: Alert, answering all questions appropriately. Cranial nerves grossly intact. Skin: No rashes or petechiae noted. Psych: Normal affect. Lymphatics: No cervical, calvicular, axillary or inguinal LAD.   LAB RESULTS:  Lab Results  Component Value Date   NA 139 12/23/2014  K 4.8 12/23/2014   CL 99 12/23/2014   CO2 25 12/23/2014   GLUCOSE 89 12/23/2014   BUN 16 12/23/2014   CREATININE 1.10 12/23/2014   CALCIUM 9.5 12/23/2014   PROT 7.0 12/23/2014   ALBUMIN 4.5 12/23/2014   AST 12 12/23/2014   ALT 26 12/23/2014   ALKPHOS 75 12/23/2014   BILITOT 0.6 12/23/2014   GFRNONAA 73 12/23/2014   GFRAA 84 12/23/2014     Lab Results  Component Value Date   WBC 5.0 12/23/2014   NEUTROABS 2.6 12/23/2014   HGB 15.5 12/18/2013   HCT 42.5 12/23/2014   MCV 88 09/04/2013   PLT 245 12/18/2013     STUDIES: No results found.  ASSESSMENT:  History of unprovoked DVT 2.  PLAN:     1. DVT: Although patient has history of unprovoked DVT 2, full hypercoagulable workup was reported as negative. Patient also has a significant family history with both his father and sister reportedly having blood clots as well.   Previously it was recommended that patient remain on lifelong anticoagulation. I agree with this recommendation. Continue Eliquis as prescribed. Patient can also continue receiving his refills from primary care. Patient expressed understanding of the risks of increased bleeding while on anticoagulation. There is no indication to repeat lower extremity ultrasound unless patient is symptomatic. No further intervention is needed. No follow-up is necessary.  Greater than 30 minutes was spent in discussion of which 50% was consultation.  Patient expressed understanding and was in agreement with this plan. He also understands that He can call clinic at any time with any questions, concerns, or complaints.    Lloyd Huger, MD   02/04/2015 1:43 PM

## 2015-02-09 ENCOUNTER — Other Ambulatory Visit: Payer: Self-pay | Admitting: Family Medicine

## 2015-03-12 ENCOUNTER — Other Ambulatory Visit: Payer: Self-pay | Admitting: Family Medicine

## 2015-03-31 ENCOUNTER — Encounter: Payer: Self-pay | Admitting: General Surgery

## 2015-03-31 ENCOUNTER — Ambulatory Visit (INDEPENDENT_AMBULATORY_CARE_PROVIDER_SITE_OTHER): Payer: BLUE CROSS/BLUE SHIELD | Admitting: General Surgery

## 2015-03-31 VITALS — BP 120/76 | HR 76 | Resp 14 | Ht 72.0 in | Wt 271.0 lb

## 2015-03-31 DIAGNOSIS — N62 Hypertrophy of breast: Secondary | ICD-10-CM | POA: Diagnosis not present

## 2015-03-31 NOTE — Patient Instructions (Signed)
Call office for any new breast issues or concerns.Return as needed.

## 2015-03-31 NOTE — Progress Notes (Signed)
Patient ID: Terry Liu, male   DOB: [birth year], 60 y.o.   MRN: [id number]  Chief Complaint  Patient presents with  . Follow-up    gynecomastia    HPI Terry Liu is a 60 y.o. male here today for his three month follow up right breast gynecomastia . Patient states he noticed about two weeks after his last appointment he felt a left breast knot. Tender to touch mainly at the end of the day  I personally reviewed the patient's history.   Marland KitchenHPI  Past Medical History  Diagnosis Date  . GERD (gastroesophageal reflux disease)   . Arthritis     Osteoarthritis  . Hyperlipidemia   . DVT of leg (deep venous thrombosis) ([place]) 2013    LLE/Dr Cynda Acres    Past Surgical History  Procedure Laterality Date  . Tonsillectomy      Family History  Problem Relation Age of Onset  . Melanoma Sister     benign breast tumor  . Diabetes Sister   . Lung cancer Father   . Lung cancer Mother   . Breast cancer Mother 41    Social History Social History  Substance Use Topics  . Smoking status: Never Smoker   . Smokeless tobacco: Never Used  . Alcohol Use: No    No Known Allergies  Current Outpatient Prescriptions  Medication Sig Dispense Refill  . Cholecalciferol (VITAMIN D-3) 1000 UNITS CAPS Take by mouth daily.    [name] ELIQUIS 5 MG TABS tablet TAKE 1 TABLET BY MOUTH TWICE DAILY 60 tablet 0  . Probiotic Product (PROBIOTIC FORMULA) CAPS Take by mouth daily.     No current facility-administered medications for this visit.    Review of Systems Review of Systems  Constitutional: Negative.   Respiratory: Negative.   Cardiovascular: Negative.     Blood pressure 120/76, pulse 76, resp. rate 14, height 6' (1.829 m), weight 271 lb (122.925 kg).  Physical Exam Physical Exam  Constitutional: He is oriented to person, place, and time. He appears well-developed and well-nourished.  Eyes: Conjunctivae are normal. No scleral icterus.  Neck: Neck supple.  Cardiovascular: Normal rate, regular  rhythm and normal heart sounds.   Pulmonary/Chest: Effort normal and breath sounds normal. Right breast exhibits no inverted nipple, no mass, no nipple discharge, no skin change and no tenderness. Left breast exhibits no inverted nipple, no mass, no nipple discharge and no skin change.    Abdominal: Soft. Normal appearance and bowel sounds are normal. There is no hepatomegaly. There is no tenderness. A hernia (umbilical hernia) is present.  Lymphadenopathy:    He has no cervical adenopathy.  Neurological: He is alert and oriented to person, place, and time.  Skin: Skin is warm and dry.   Right nipple area is much defined since last visit.      Assessment    Mild gynecomastia, slowly resolving., Minimally symptomatic.     Plan    The patient will complete monthly self exams and reports that there is any change in the area of residual breast thickening. Early follow-up if he becomes symptomatic with local discomfort or appreciates a palpable change in the breast. Patient to return as needed.    PCP:  Rosanna Randy  This information has been scribed by Gaspar Cola CMA.    Robert Bellow 04/01/2015, 8:07 PM

## 2015-04-16 ENCOUNTER — Other Ambulatory Visit: Payer: Self-pay | Admitting: Family Medicine

## 2015-05-20 ENCOUNTER — Other Ambulatory Visit: Payer: Self-pay | Admitting: Family Medicine

## 2015-05-20 NOTE — Telephone Encounter (Signed)
Needs appt this summer

## 2015-06-22 ENCOUNTER — Ambulatory Visit: Payer: 59 | Admitting: Family Medicine

## 2015-08-13 IMAGING — CT CT ABD-PELV W/O CM
2 of 4 series · 16 of 46 positions shown, 18 images · non-contrast
Comparison: CT 12/10/2009

CLINICAL DATA: 58-year-old with left-sided pain.

EXAM:
CT ABDOMEN AND PELVIS WITHOUT CONTRAST
TECHNIQUE: Multidetector CT imaging of the abdomen and pelvis was performed
following the standard protocol without IV contrast.

[Series 2: stone standard full · axial · 0.95mm/px · z∈[-1139,-649]mm · 13 of 108 slices shown, 15 images]
[im 5/108  soft-tissue]
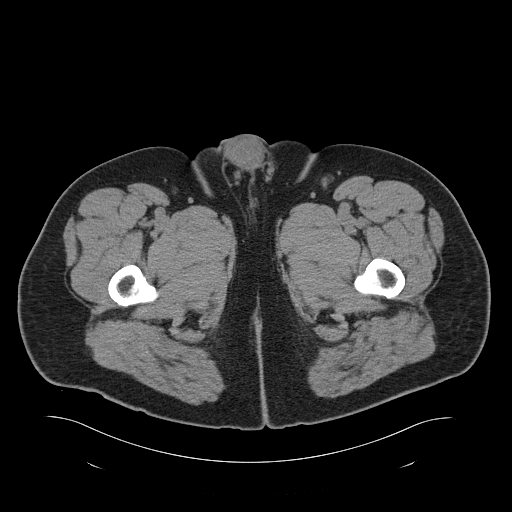
[im 5/108  bone]
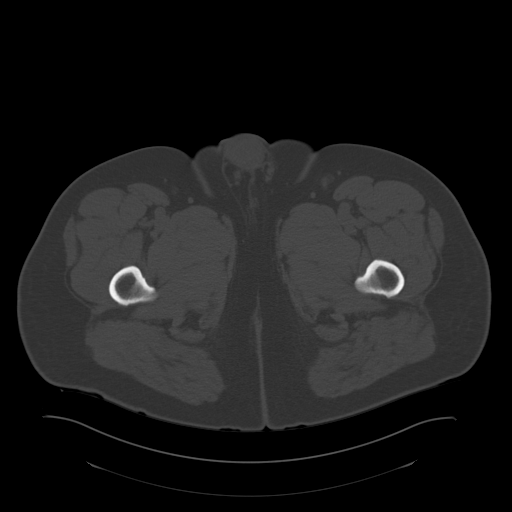
[im 14/108  soft-tissue]
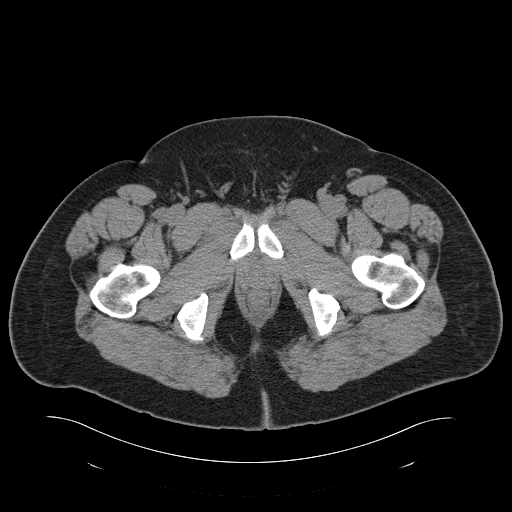
[im 24/108  soft-tissue]
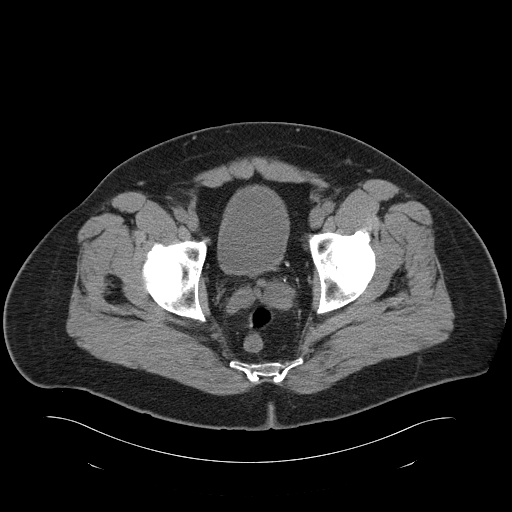
[im 28/108  soft-tissue]
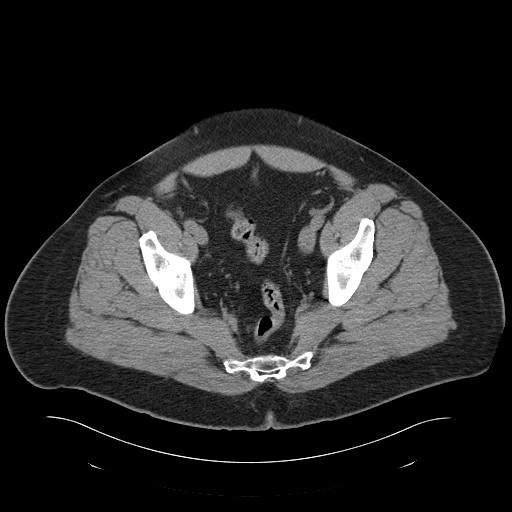
[im 38/108  soft-tissue]
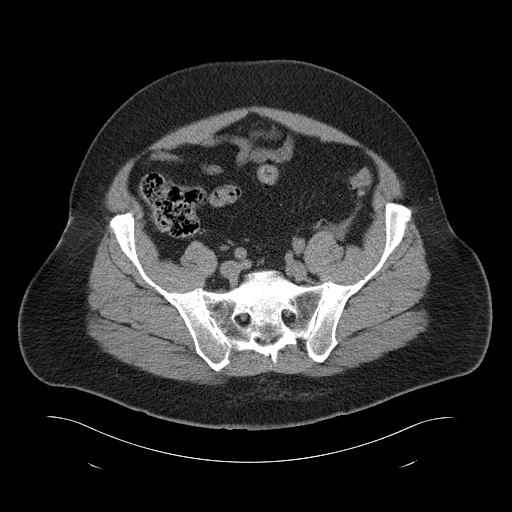
[im 47/108  soft-tissue]
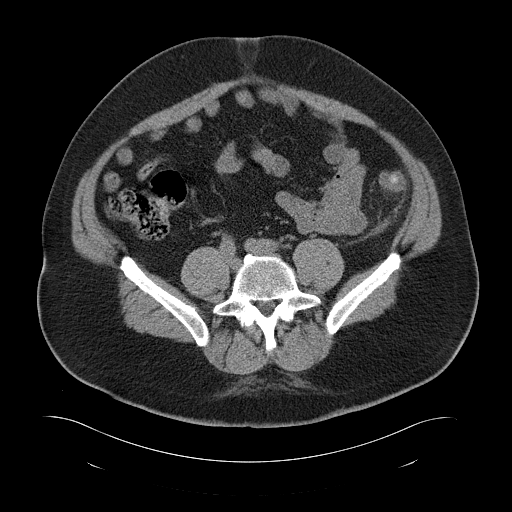
[im 56/108  soft-tissue]
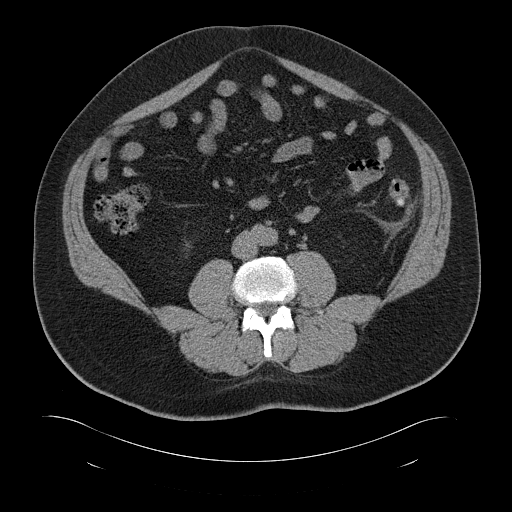
[im 61/108  soft-tissue]
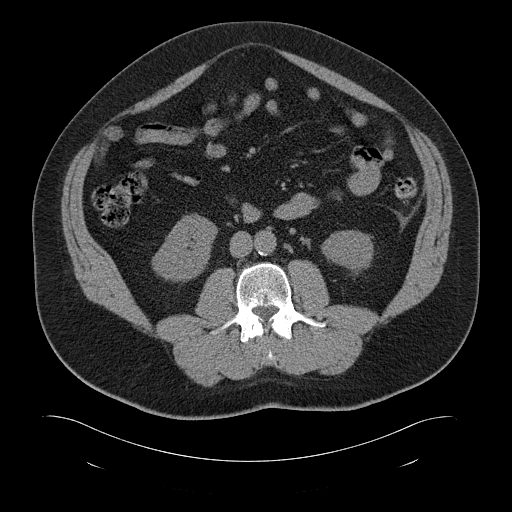
[im 70/108  soft-tissue]
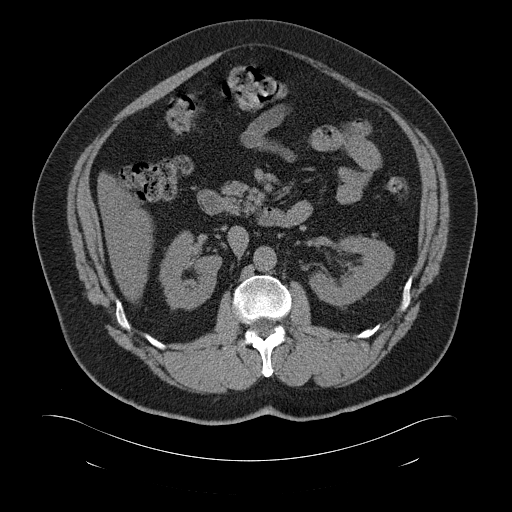
[im 70/108  bone]
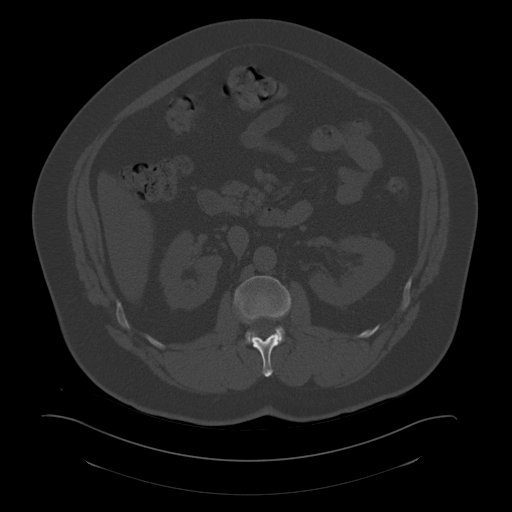
[im 80/108  soft-tissue]
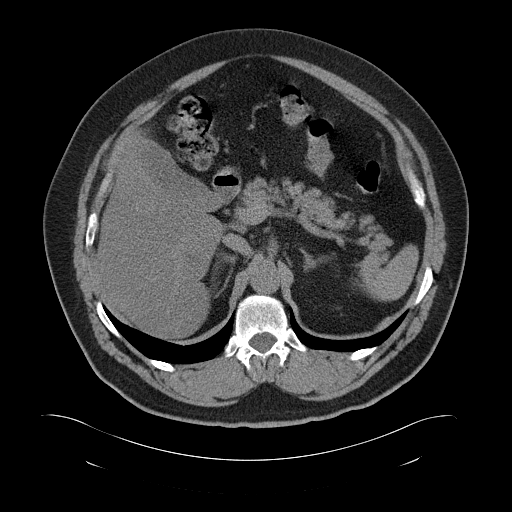
[im 84/108  soft-tissue]
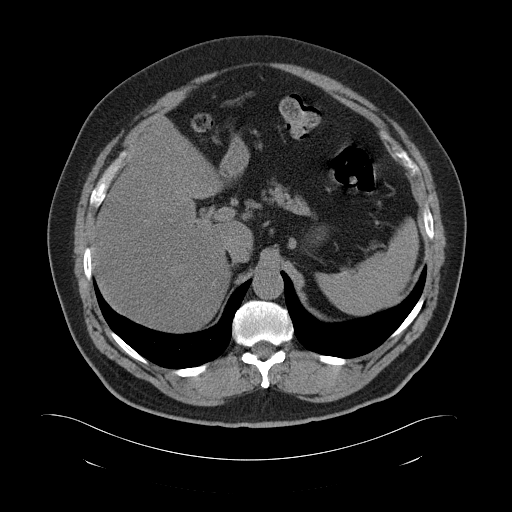
[im 94/108  soft-tissue]
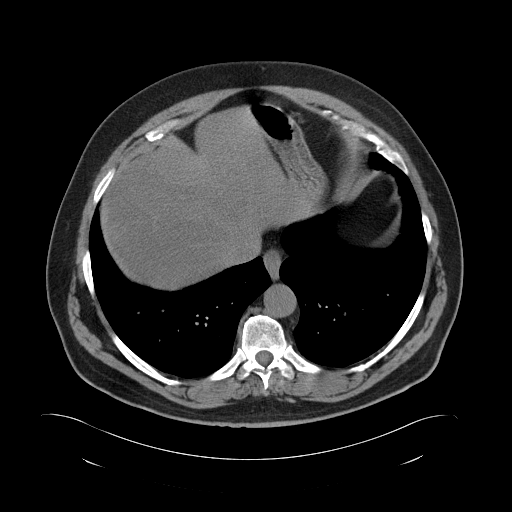
[im 103/108  soft-tissue]
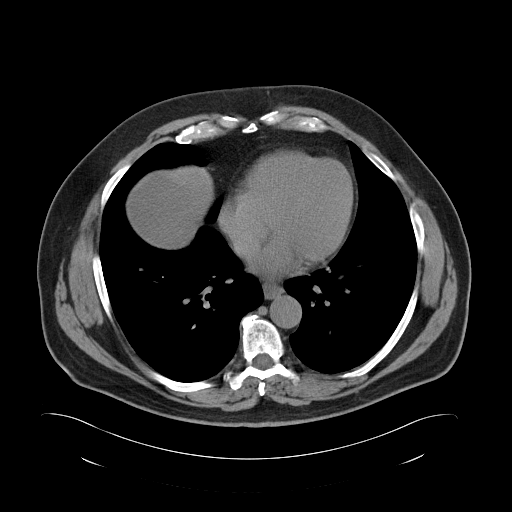

[Series 5: cor stone standard full · coronal · 0.88mm/px · 3 of 201 slices shown]
[im 67/201  soft-tissue]
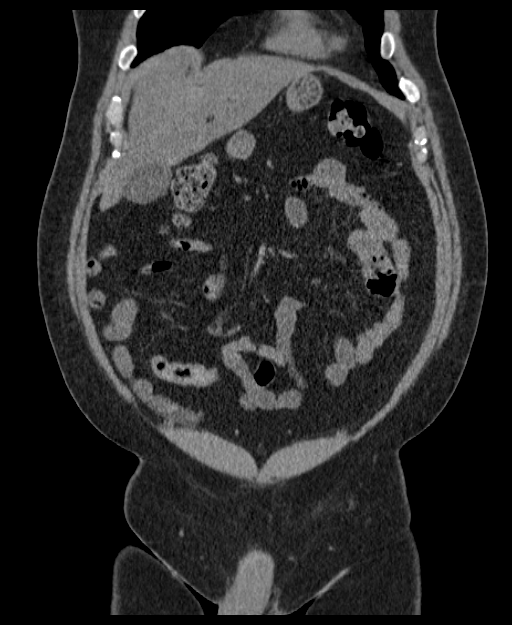
[im 89/201  soft-tissue]
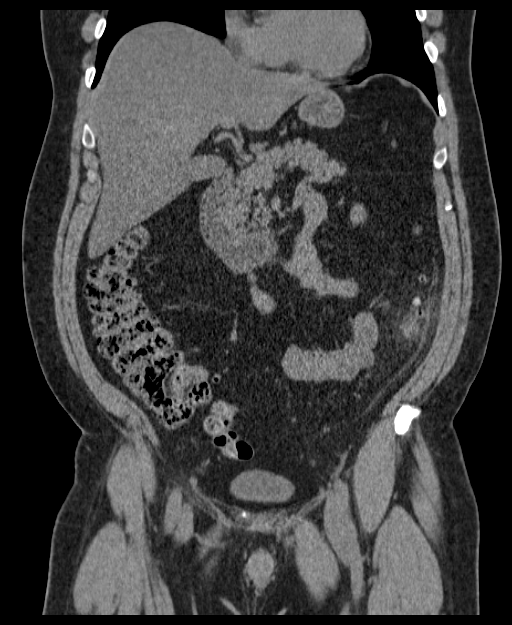
[im 112/201  soft-tissue]
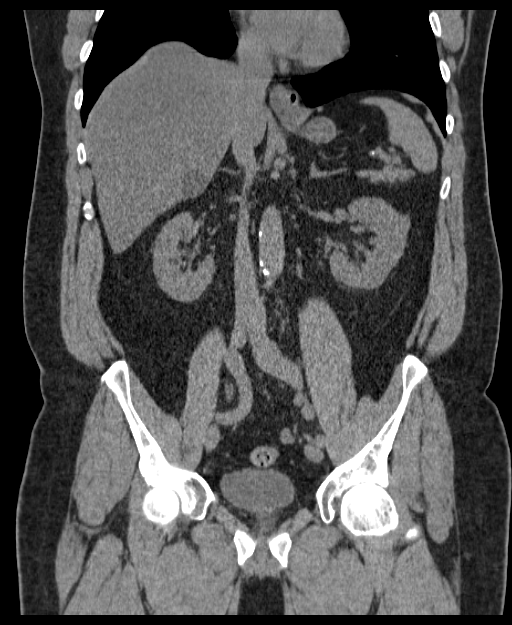

[16 of 46 positions shown; findings below may reference images not displayed]

FINDINGS: There are small punctate peripheral and pleural-based densities in
the right lower lobe which have not significantly changed and likely
benign based on the stability. Subtle pleural-based density along
the lingula on sequence 4, image 2 is stable. There is no evidence
for free air.

Again noted are low-density structures in the liver. The largest is
along the medial right hepatic lobe and measures 3.2 cm and
previously measured 2.6 cm. These low-density structures likely
represent cysts. There is low density throughout the liver
parenchyma suggesting hepatic steatosis. Normal appearance of the
spleen, pancreas, gallbladder, adrenal glands and kidneys. There is
probably a small exophytic cyst along the right kidney upper pole.
No evidence for kidney stones or hydronephrosis.

Few calcifications in the prostate. Normal appearance of the urinary
bladder. Small amount of fluid in the left lower quadrant of the
abdomen. There is inflammation around the left colon and centered
around colonic diverticula. This is best seen on sequence 2, image
58. There is left pericolonic edema and fluid. Normal appearance of
the appendix. Normal appearance of the small bowel. No significant
lymphadenopathy within the abdomen or pelvis.

No acute bone abnormality.
IMPRESSION: Inflammatory and fluid centered around the left colon and colonic
diverticula. Findings are most compatible with acute diverticulitis.
There is no evidence to suggest an abscess.

Stable punctate nodular densities in the lungs. These have not
significantly changed since 8755.

Low-attenuation of the liver is compatible with hepatic steatosis.
Again noted are hepatic cysts. There has been enlargement of the
dominant cyst in the right hepatic lobe.

These results will be called to the ordering clinician or
representative by the Radiologist Assistant, and communication
documented in the PACS or zVision Dashboard.

## 2015-09-03 ENCOUNTER — Telehealth: Payer: Self-pay | Admitting: Family Medicine

## 2015-09-03 NOTE — Telephone Encounter (Signed)
Pt called in saying he was having minor skin surgery for basal cell.  He is taking eliquis.  They told him it was ok for him to take it but he read for minor surgery he should off of it for 24 hrs.  He wants to know what you think.    Patients call back [phone]  Please advise...  Thanks Con Memos

## 2015-09-03 NOTE — Telephone Encounter (Signed)
What do you receommend?

## 2015-09-07 NOTE — Telephone Encounter (Signed)
Pt informed and voiced understanding of results. 

## 2015-09-07 NOTE — Telephone Encounter (Signed)
It is up to doctor doing procedure.

## 2015-09-17 ENCOUNTER — Ambulatory Visit
Admission: RE | Admit: 2015-09-17 | Discharge: 2015-09-17 | Disposition: A | Discharge status: Home / Self Care | Payer: BLUE CROSS/BLUE SHIELD | Source: Ambulatory Visit | Attending: Family Medicine | Admitting: Family Medicine

## 2015-09-17 ENCOUNTER — Ambulatory Visit (INDEPENDENT_AMBULATORY_CARE_PROVIDER_SITE_OTHER): Payer: BLUE CROSS/BLUE SHIELD | Admitting: Family Medicine

## 2015-09-17 ENCOUNTER — Encounter: Payer: Self-pay | Admitting: Family Medicine

## 2015-09-17 VITALS — BP 112/74 | HR 70 | Temp 97.8°F | Resp 16 | Wt 287.0 lb

## 2015-09-17 DIAGNOSIS — N644 Mastodynia: Secondary | ICD-10-CM

## 2015-09-17 DIAGNOSIS — R079 Chest pain, unspecified: Secondary | ICD-10-CM | POA: Insufficient documentation

## 2015-09-17 DIAGNOSIS — N62 Hypertrophy of breast: Secondary | ICD-10-CM | POA: Diagnosis not present

## 2015-09-17 NOTE — Progress Notes (Signed)
Subjective:  HPI Pt is here for breast pain/tenderness. He was seen back in November for his and went to Dr. Terri Piedra and he ordered a u/s. Showed nothing abnormal. Pt is still having this pain/tenderness after 10 months. He is concerned because he read online that this could be a sign in men of lung cancer. Being that his parents had it, it worries him. He would like to discuss what needs to be done next. Patient has never been a smoker. He has no shortness of breath no cough.   Prior to Admission medications   Medication Sig Start Date End Date Taking? Authorizing Provider  ELIQUIS 5 MG TABS tablet TAKE 1 TABLET BY MOUTH TWICE DAILY 05/20/15  Yes Richard Maceo Pro., MD    Patient Active Problem List   Diagnosis Date Noted  . Gynecomastia, male 12/31/2014  . Closed head injury 09/19/2014  . GERD (gastroesophageal reflux disease) 09/19/2014  . ED (erectile dysfunction) 09/19/2014  . Obesity 09/19/2014  . Umbilical hernia without obstruction or gangrene 09/19/2014  . Hemorrhoids, internal 09/19/2014  . 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 Diverticulitis large intestine w/o perforation or abscess w/o bleeding 09/19/2014  . Fluttering heart ([place]) 09/19/2014  . Thrombophlebitis leg 09/19/2014  . Hematoma 09/19/2014  . Osteoarthritis 09/19/2014  . Hyperlipidemia 09/19/2014  . DVT (deep venous thrombosis) ([place]) 09/19/2014  . Sleep apnea 09/19/2014  . Snoring 09/19/2014  . Dermatitis, eczematoid 09/19/2014    Past Medical History:  Diagnosis Date  . Arthritis    Osteoarthritis  . DVT of leg (deep venous thrombosis) ([place]) 2013   LLE/Dr Cynda Acres  . GERD (gastroesophageal reflux disease)   . Hyperlipidemia     Social History   Social History  . Marital status: Married    Spouse name: N/A  . Number of children: N/A  . Years of education: N/A   Occupational History  . Not on file.   Social History Main Topics  . Smoking status: Never Smoker  . Smokeless tobacco: Never Used  .  Alcohol use No  . Drug use: No  . Sexual activity: Not on file   Other Topics Concern  . Not on file   Social History Narrative  . No narrative on file    No Known Allergies  Review of Systems  Constitutional: Negative.   HENT: Negative.   Eyes: Negative.   Respiratory: Negative.   Cardiovascular: Positive for chest pain (breast pain).  Gastrointestinal: Negative.   Genitourinary: Negative.   Musculoskeletal: Negative.   Skin: Negative.   Neurological: Negative.   Endo/Heme/Allergies: Negative.   Psychiatric/Behavioral: Negative.     Immunization History  Administered Date(s) Administered  . Influenza,inj,Quad PF,36+ Mos 11/07/2014  . Tdap 12/06/2011   Objective:  BP 112/74 (BP Location: Left Arm, Patient Position: Sitting, Cuff Size: Large)   Pulse 70   Temp 97.8 F (36.6 C) (Oral)   Resp 16   Wt 287 lb (130.2 kg)   BMI 38.92 kg/m   Physical Exam  Constitutional: He is oriented to person, place, and time and well-developed, well-nourished, and in no distress.  HENT:  Head: Normocephalic and atraumatic.  Nose: Nose normal.  Eyes: Conjunctivae are normal. Scleral icterus is present.  Neck: Normal range of motion. Neck supple. No thyromegaly present.  Cardiovascular: Normal rate, regular rhythm, normal heart sounds and intact distal pulses.   Breast tenderness  Pulmonary/Chest: Effort normal and breath sounds normal.  Abdominal: Soft. Bowel sounds are normal.  Small reducible umbilical hernia  Musculoskeletal: Normal range of motion.  Lymphadenopathy:    He has no cervical adenopathy.  Neurological: He is alert and oriented to person, place, and time. He has normal reflexes. Gait normal. GCS score is 15.  Skin: Skin is warm and dry.  Psychiatric: Mood, memory, affect and judgment normal.    Lab Results  Component Value Date   WBC 5.0 12/23/2014   HGB 15.5 12/18/2013   HCT 42.5 12/23/2014   PLT 247 12/23/2014   GLUCOSE 89 12/23/2014   CHOL 228 (H)  12/23/2014   TRIG 301 (H) 12/23/2014   HDL 44 12/23/2014   LDLCALC 124 (H) 12/23/2014   TSH 3.530 12/23/2014   PSA 0.9 12/18/2013   INR 2.1 09/04/2013    CMP     Component Value Date/Time   NA 139 12/23/2014 1119   NA 140 09/04/2013 0508   K 4.8 12/23/2014 1119   K 4.2 09/18/2013 1643   CL 99 12/23/2014 1119   CL 106 09/04/2013 0508   CO2 25 12/23/2014 1119   CO2 27 09/04/2013 0508   GLUCOSE 89 12/23/2014 1119   GLUCOSE 92 09/04/2013 0508   BUN 16 12/23/2014 1119   BUN 14 09/04/2013 0508   CREATININE 1.10 12/23/2014 1119   CREATININE 1.12 09/04/2013 0508   CALCIUM 9.5 12/23/2014 1119   CALCIUM 8.9 09/18/2013 1643   PROT 7.0 12/23/2014 1119   ALBUMIN 4.5 12/23/2014 1119   AST 12 12/23/2014 1119   ALT 26 12/23/2014 1119   ALKPHOS 75 12/23/2014 1119   BILITOT 0.6 12/23/2014 1119   GFRNONAA 73 12/23/2014 1119   GFRNONAA >60 09/04/2013 0508   GFRAA 84 12/23/2014 1119   GFRAA >60 09/04/2013 0508    Assessment and Plan :  1. Chest pain, unspecified chest pain type The pain is really related  to the breast area. - DG Chest 2 View; Future Clinically I do not think patient needs CT scan of this time. He is scared about the possibility of lung cancer but there is no indication that that is an issue for him. 2. Mastalgia   3. Gynecomastia, male With a normal exam today I'm not sure either referral back to Dr. Tollie Pizza for consideration of biopsy would be helpful. - Prolactin 4. Umbilical hernia Consider surgical referral. 5. Morbid obesity 6. Unprovoked DVT/coagulopathy Lifelong anticoagulant. We will attempt to obtain workup from Dr. Inez Pilgrim from a few years ago when patient had DVT. Presently this is not in the chart. Patient was seen and examined by Dr. Miguel Aschoff, and noted scribed by Webb Laws, [place] MD [facility] 09/17/2015 1:57 PM

## 2015-09-18 ENCOUNTER — Other Ambulatory Visit: Payer: Self-pay | Admitting: Emergency Medicine

## 2015-09-18 DIAGNOSIS — R7989 Other specified abnormal findings of blood chemistry: Secondary | ICD-10-CM

## 2015-09-18 DIAGNOSIS — E229 Hyperfunction of pituitary gland, unspecified: Principal | ICD-10-CM

## 2015-09-18 LAB — PROLACTIN: PROLACTIN: 17.6 ng/mL — AB (ref 4.0–15.2)

## 2015-09-22 ENCOUNTER — Telehealth: Payer: Self-pay | Admitting: Family Medicine

## 2015-09-22 DIAGNOSIS — N62 Hypertrophy of breast: Secondary | ICD-10-CM

## 2015-09-22 DIAGNOSIS — R7989 Other specified abnormal findings of blood chemistry: Secondary | ICD-10-CM

## 2015-09-22 DIAGNOSIS — E229 Hyperfunction of pituitary gland, unspecified: Secondary | ICD-10-CM

## 2015-09-22 NOTE — Telephone Encounter (Signed)
Pt states that [facility] endocrinology department has told him they are not able to get him in until near the end of Sept.Pt wants to know if you think he will be ok waiting this long for abnormal labs

## 2015-09-23 NOTE — Telephone Encounter (Signed)
Judson Roch, can you schedule this? Is this something that I need to order? Please advise. Thanks!

## 2015-09-23 NOTE — Telephone Encounter (Signed)
Please arrange for a pituitary MRI for gynecomastia and elevated prolactin

## 2015-09-23 NOTE — Telephone Encounter (Signed)
Please review-aa 

## 2015-09-24 NOTE — Telephone Encounter (Signed)
I need order entered into EPIC,Thanks

## 2015-09-25 DIAGNOSIS — R7989 Other specified abnormal findings of blood chemistry: Secondary | ICD-10-CM | POA: Insufficient documentation

## 2015-09-25 DIAGNOSIS — E229 Hyperfunction of pituitary gland, unspecified: Secondary | ICD-10-CM

## 2015-09-25 NOTE — Telephone Encounter (Signed)
Ordered as MR Brain per Scotty at [facility]. Renaldo Fiddler, CMA

## 2015-10-10 ENCOUNTER — Other Ambulatory Visit: Payer: Self-pay | Admitting: Family Medicine

## 2015-10-13 ENCOUNTER — Ambulatory Visit
Admission: RE | Admit: 2015-10-13 | Discharge: 2015-10-13 | Disposition: A | Discharge status: Home / Self Care | Payer: BLUE CROSS/BLUE SHIELD | Source: Ambulatory Visit | Attending: Family Medicine | Admitting: Family Medicine

## 2015-10-13 ENCOUNTER — Telehealth: Payer: Self-pay | Admitting: Family Medicine

## 2015-10-13 DIAGNOSIS — N62 Hypertrophy of breast: Secondary | ICD-10-CM | POA: Insufficient documentation

## 2015-10-13 DIAGNOSIS — E229 Hyperfunction of pituitary gland, unspecified: Secondary | ICD-10-CM | POA: Insufficient documentation

## 2015-10-13 DIAGNOSIS — R7989 Other specified abnormal findings of blood chemistry: Secondary | ICD-10-CM

## 2015-10-13 LAB — POCT I-STAT CREATININE: CREATININE: 1.1 mg/dL (ref 0.61–1.24)

## 2015-10-13 NOTE — Telephone Encounter (Signed)
Call in rx for diazepam 10mg  one tablet 1 hour prior to procedure. #1, no refills. Make sure he has someone to drive him to and from MRI. He can take 3 ibuprofen for pain, but cannot take any prescription pain medications due to interaction with diazepam

## 2015-10-13 NOTE — Telephone Encounter (Signed)
Please advise 

## 2015-10-13 NOTE — Telephone Encounter (Signed)
Pt called saying he went to have his MRI this am but could not because of his anxiety and back pain.  They recommended him to call Dr. Rosanna Randy and get him to give him something for the pain and anxiety.and reschd. The MRI.  Pt's call back is [phone]  Please advise.  Patient uses [facility] in [place].

## 2015-10-14 MED ORDER — DIAZEPAM 10 MG PO TABS
ORAL_TABLET | ORAL | 0 refills | Status: DC
Start: 2015-10-14 — End: 2015-12-31

## 2015-10-14 NOTE — Telephone Encounter (Signed)
RX called in, pt advised-aa

## 2015-10-16 ENCOUNTER — Ambulatory Visit
Admission: RE | Admit: 2015-10-16 | Discharge: 2015-10-16 | Disposition: A | Discharge status: Home / Self Care | Payer: BLUE CROSS/BLUE SHIELD | Source: Ambulatory Visit | Attending: Family Medicine | Admitting: Family Medicine

## 2015-10-16 DIAGNOSIS — N62 Hypertrophy of breast: Secondary | ICD-10-CM | POA: Diagnosis present

## 2015-10-16 DIAGNOSIS — E229 Hyperfunction of pituitary gland, unspecified: Secondary | ICD-10-CM | POA: Diagnosis present

## 2015-10-16 DIAGNOSIS — M899 Disorder of bone, unspecified: Secondary | ICD-10-CM | POA: Insufficient documentation

## 2015-10-16 MED ORDER — GADOBENATE DIMEGLUMINE 529 MG/ML IV SOLN
10.0000 mL | Freq: Once | INTRAVENOUS | Status: AC | PRN
  Administered 2015-10-16: 10 mL via INTRAVENOUS

## 2015-10-19 NOTE — Telephone Encounter (Signed)
-----   Message from [name], MD sent at 10/19/2015  8:44 AM EDT ----- Small microadenoma of pituitary. This is not a cancer but does need follow-up with endocrinology. They may refer him to neurosurgery.

## 2015-10-19 NOTE — Telephone Encounter (Signed)
Patient was advised of results and he has appointment set up with Dr Gabriel Carina but wanted to see if we can maybe refer him to  if there is an endocrinologist. Please let patient know and if its going to be longer than the appointment he has with Dr Gabriel Carina on 10/4 then he will keep it.-aa

## 2015-10-21 NOTE — Telephone Encounter (Signed)
I spoke with 2 different offices in [place].They are scheduling out mid to late Oct.Pt states he will keep appointment with Dr Gabriel Carina 11/03/15 at 2:15

## 2015-11-07 ENCOUNTER — Ambulatory Visit (INDEPENDENT_AMBULATORY_CARE_PROVIDER_SITE_OTHER): Payer: BLUE CROSS/BLUE SHIELD

## 2015-11-07 DIAGNOSIS — Z23 Encounter for immunization: Secondary | ICD-10-CM | POA: Diagnosis not present

## 2015-12-31 ENCOUNTER — Encounter: Payer: BLUE CROSS/BLUE SHIELD | Admitting: Family Medicine

## 2015-12-31 ENCOUNTER — Ambulatory Visit (INDEPENDENT_AMBULATORY_CARE_PROVIDER_SITE_OTHER): Payer: BLUE CROSS/BLUE SHIELD | Admitting: Family Medicine

## 2015-12-31 VITALS — BP 112/60 | HR 64 | Temp 97.9°F | Resp 16 | Ht 72.0 in | Wt 289.0 lb

## 2015-12-31 DIAGNOSIS — Z0000 Encounter for general adult medical examination without abnormal findings: Secondary | ICD-10-CM | POA: Diagnosis not present

## 2015-12-31 DIAGNOSIS — Z125 Encounter for screening for malignant neoplasm of prostate: Secondary | ICD-10-CM

## 2015-12-31 DIAGNOSIS — N529 Male erectile dysfunction, unspecified: Secondary | ICD-10-CM | POA: Diagnosis not present

## 2015-12-31 DIAGNOSIS — Z1211 Encounter for screening for malignant neoplasm of colon: Secondary | ICD-10-CM

## 2015-12-31 LAB — POCT URINALYSIS DIPSTICK
Bilirubin, UA: NEGATIVE
Glucose, UA: NEGATIVE
KETONES UA: NEGATIVE
Leukocytes, UA: NEGATIVE
Nitrite, UA: NEGATIVE
PROTEIN UA: NEGATIVE
RBC UA: NEGATIVE
SPEC GRAV UA: 1.025
UROBILINOGEN UA: NEGATIVE
pH, UA: 6.5

## 2015-12-31 LAB — IFOBT (OCCULT BLOOD): IMMUNOLOGICAL FECAL OCCULT BLOOD TEST: POSITIVE

## 2015-12-31 MED ORDER — SILDENAFIL CITRATE 20 MG PO TABS: 20.0000 mg | ORAL_TABLET | Freq: Three times a day (TID) | ORAL | 12 refills | Status: DC

## 2015-12-31 NOTE — Progress Notes (Signed)
Patient: Terry Liu, Male    DOB: 07-13-1955, 60 y.o.   MRN: [id number] Visit Date: 12/31/2015  Today's Provider: Wilhemena Durie, MD   Chief Complaint  Patient presents with  . Annual Exam   Subjective:  Terry Liu is a 60 y.o. male who presents today for health maintenance and complete physical. He feels well. He reports exercising none. He reports he is sleeping poorly. Overall patient feels well. He has no exercise. He does coach basketball and enjoys that. Emotionally he feels well.   2010 Colonoscopy. Dr. Allen Norris, normal Immunization History  Administered Date(s) Administered  . Influenza,inj,Quad PF,36+ Mos 11/07/2014, 11/07/2015  . Tdap 12/06/2011    Review of Systems  Constitutional: Negative.   HENT: Positive for dental problem and tinnitus.   Eyes: Negative.   Respiratory: Positive for apnea.   Cardiovascular: Positive for palpitations.  Gastrointestinal: Negative.   Endocrine: Negative.   Genitourinary: Negative.   Musculoskeletal: Negative.   Skin: Negative.   Allergic/Immunologic: Negative.   Neurological: Negative.   Hematological: Bruises/bleeds easily.  Psychiatric/Behavioral: Negative.     Social History   Social History  . Marital status: Married    Spouse name: N/A  . Number of children: N/A  . Years of education: N/A   Occupational History  . Not on file.   Social History Main Topics  . Smoking status: Never Smoker  . Smokeless tobacco: Never Used  . Alcohol use No  . Drug use: No  . Sexual activity: Not on file   Other Topics Concern  . Not on file   Social History Narrative  . No narrative on file    Patient Active Problem List   Diagnosis Date Noted  . Elevated prolactin level ([place]) 09/25/2015  . Gynecomastia, male 12/31/2014  . Closed head injury 09/19/2014  . GERD (gastroesophageal reflux disease) 09/19/2014  . ED (erectile dysfunction) 09/19/2014  . Obesity 09/19/2014  . Umbilical hernia without obstruction or  gangrene 09/19/2014  . Hemorrhoids, internal 09/19/2014  . 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 Diverticulitis large intestine w/o perforation or abscess w/o bleeding 09/19/2014  . Fluttering heart 09/19/2014  . 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 Hematoma 09/19/2014  . Osteoarthritis 09/19/2014  . Hyperlipidemia 09/19/2014  . DVT (deep venous thrombosis) ([place]) 09/19/2014  . Sleep apnea 09/19/2014  . Snoring 09/19/2014  . Dermatitis, eczematoid 09/19/2014    Past Surgical History:  Procedure Laterality Date  . TONSILLECTOMY      His family history includes Breast cancer (age of onset: 57) in his mother; Diabetes in his sister; Lung cancer in his father and mother; Melanoma in his sister.     Outpatient Encounter Prescriptions as of 12/31/2015  Medication Sig  . cholecalciferol (VITAMIN D) 1000 units tablet Take 1,000 Units by mouth daily.  [name] ELIQUIS 5 MG TABS tablet TAKE 1 TABLET BY MOUTH TWICE DAILY  . [DISCONTINUED] diazepam (VALIUM) 10 MG tablet Take 1 tablet 1 hour prior to procedure   No facility-administered encounter medications on file as of 12/31/2015.     Patient Care Team: [name], MD as PCP - General (Family Medicine) [name], MD (Family Medicine) Robert Bellow, MD (General Surgery)      Objective:   Vitals:  Vitals:   12/31/15 0957  BP: 112/60  Pulse: 64  Resp: 16  Temp: 97.9 F (36.6 C)  TempSrc: Oral  Weight: 289 lb (131.1 kg)  Height: 6' (1.829 m)    Physical Exam  Constitutional: He is oriented to person, place, and time. He appears well-developed and well-nourished.  HENT:  Head: Normocephalic and atraumatic.  Right Ear: External ear normal.  Left Ear: External ear normal.  Nose: Nose normal.  Mouth/Throat: Oropharynx is clear and moist.  Eyes: Conjunctivae and EOM are normal. Pupils are equal, round, and reactive to light.  Neck: Normal range of motion. Neck supple.  Cardiovascular: Normal rate, regular  rhythm, normal heart sounds and intact distal pulses.   Pulmonary/Chest: Effort normal and breath sounds normal.  Abdominal: Soft. Bowel sounds are normal.  Small, reducible umbilical hernia  Genitourinary: Rectum normal, prostate normal and penis normal.  Musculoskeletal: Normal range of motion.  Neurological: He is alert and oriented to person, place, and time.  Skin: Skin is warm and dry.  Psychiatric: He has a normal mood and affect. His behavior is normal. Judgment and thought content normal.   Current Exercise Habits: The patient does not participate in regular exercise at present   Fall Risk  12/31/2015 12/23/2014  Falls in the past year? Yes No  Number falls in past yr: 2 or more -  Injury with Fall? No -   Functional Status Survey: Is the patient deaf or have difficulty hearing?: No Does the patient have difficulty seeing, even when wearing glasses/contacts?: No Does the patient have difficulty concentrating, remembering, or making decisions?: No Does the patient have difficulty walking or climbing stairs?: No Does the patient have difficulty dressing or bathing?: No Does the patient have difficulty doing errands alone such as visiting a doctor's office or shopping?: No   Depression Screen PHQ 2/9 Scores 12/31/2015 12/23/2014  PHQ - 2 Score 0 0      Assessment & Plan:     Routine Health Maintenance and Physical Exam  Exercise Activities and Dietary recommendations Goals    None      Immunization History  Administered Date(s) Administered  . Influenza,inj,Quad PF,36+ Mos 11/07/2014, 11/07/2015  . Tdap 12/06/2011    Health Maintenance  Topic Date Due  . Hepatitis C Screening  August 24, 1955  . HIV Screening  02/15/1969  . ZOSTAVAX  02/15/2014  . COLONOSCOPY  03/18/2018  . TETANUS/TDAP  12/05/2021  . INFLUENZA VACCINE  Completed    Diet and exercise discussed at some length and stressed. Discussed health benefits of physical activity, and encouraged him  to engage in regular exercise appropriate for his age and condition.      1. Annual physical exam  - CBC with Differential/Platelet - Comprehensive metabolic panel - Lipid Panel With LDL/HDL Ratio - TSH - POCT urinalysis dipstick  2. Prostate cancer screening  - PSA  3. Colon cancer screening  - IFOBT POC (occult bld, rslt in office)  4. Erectile dysfunction, unspecified erectile dysfunction type  - sildenafil (REVATIO) 20 MG tablet; Take 1 tablet (20 mg total) by mouth 3 (three) times daily.  Dispense: 24 tablet; Refill: 12 5. Obesity 6. Pituitary adenoma Followed by endocrinology 7. Small umbilical hernia Refer to surgery   HPI, Exam and A&P Transcribed under the direction and in the presence of Wilhemena Durie., MD. Electronically Signed: Althea Charon, RMA

## 2016-01-01 ENCOUNTER — Telehealth: Payer: Self-pay

## 2016-01-01 LAB — CBC WITH DIFFERENTIAL/PLATELET
BASOS ABS: 0 10*3/uL (ref 0.0–0.2)
Basos: 0 %
EOS (ABSOLUTE): 0.2 10*3/uL (ref 0.0–0.4)
Eos: 3 %
Hematocrit: 42.1 % (ref 37.5–51.0)
Hemoglobin: 14.3 g/dL (ref 12.6–17.7)
Immature Grans (Abs): 0 10*3/uL (ref 0.0–0.1)
Immature Granulocytes: 1 %
LYMPHS ABS: 1.9 10*3/uL (ref 0.7–3.1)
Lymphs: 32 %
MCH: 29.7 pg (ref 26.6–33.0)
MCHC: 34 g/dL (ref 31.5–35.7)
MCV: 88 fL (ref 79–97)
MONOS ABS: 0.7 10*3/uL (ref 0.1–0.9)
Monocytes: 12 %
NEUTROS ABS: 3.2 10*3/uL (ref 1.4–7.0)
Neutrophils: 52 %
PLATELETS: 241 10*3/uL (ref 150–379)
RBC: 4.81 x10E6/uL (ref 4.14–5.80)
RDW: 13.7 % (ref 12.3–15.4)
WBC: 5.9 10*3/uL (ref 3.4–10.8)

## 2016-01-01 LAB — COMPREHENSIVE METABOLIC PANEL
A/G RATIO: 2 (ref 1.2–2.2)
ALBUMIN: 4.3 g/dL (ref 3.6–4.8)
ALK PHOS: 79 IU/L (ref 39–117)
ALT: 23 IU/L (ref 0–44)
AST: 9 IU/L (ref 0–40)
BILIRUBIN TOTAL: 0.6 mg/dL (ref 0.0–1.2)
BUN / CREAT RATIO: 14 (ref 10–24)
BUN: 15 mg/dL (ref 8–27)
CHLORIDE: 102 mmol/L (ref 96–106)
CO2: 23 mmol/L (ref 18–29)
Calcium: 9.2 mg/dL (ref 8.6–10.2)
Creatinine, Ser: 1.09 mg/dL (ref 0.76–1.27)
GFR calc Af Amer: 84 mL/min/{1.73_m2} (ref >59)
GFR calc non Af Amer: 73 mL/min/{1.73_m2} (ref >59)
GLUCOSE: 90 mg/dL (ref 65–99)
Globulin, Total: 2.2 g/dL (ref 1.5–4.5)
POTASSIUM: 4.9 mmol/L (ref 3.5–5.2)
Sodium: 142 mmol/L (ref 134–144)
Total Protein: 6.5 g/dL (ref 6.0–8.5)

## 2016-01-01 LAB — LIPID PANEL WITH LDL/HDL RATIO
CHOLESTEROL TOTAL: 199 mg/dL (ref 100–199)
HDL: 44 mg/dL (ref >39)
LDL Calculated: 113 mg/dL — ABNORMAL HIGH (ref 0–99)
LDl/HDL Ratio: 2.6 ratio units (ref 0.0–3.6)
Triglycerides: 212 mg/dL — ABNORMAL HIGH (ref 0–149)
VLDL CHOLESTEROL CAL: 42 mg/dL — AB (ref 5–40)

## 2016-01-01 LAB — TSH: TSH: 2.24 u[IU]/mL (ref 0.450–4.500)

## 2016-01-01 LAB — PSA: PROSTATE SPECIFIC AG, SERUM: 0.9 ng/mL (ref 0.0–4.0)

## 2016-01-01 NOTE — Telephone Encounter (Signed)
[id number] 

## 2016-01-01 NOTE — Telephone Encounter (Signed)
-----   Message from [name], MD sent at 01/01/2016  8:42 AM EST ----- Labs okay.

## 2016-01-01 NOTE — Telephone Encounter (Signed)
Pt advised-aa 

## 2016-06-28 ENCOUNTER — Encounter: Payer: Self-pay | Admitting: Family Medicine

## 2016-06-28 ENCOUNTER — Ambulatory Visit (INDEPENDENT_AMBULATORY_CARE_PROVIDER_SITE_OTHER): Payer: BLUE CROSS/BLUE SHIELD | Admitting: Family Medicine

## 2016-06-28 VITALS — BP 122/84 | HR 82 | Temp 98.7°F | Resp 14 | Wt 298.0 lb

## 2016-06-28 DIAGNOSIS — I82409 Acute embolism and thrombosis of unspecified deep veins of unspecified lower extremity: Secondary | ICD-10-CM

## 2016-06-28 DIAGNOSIS — R0789 Other chest pain: Secondary | ICD-10-CM

## 2016-06-28 DIAGNOSIS — R5383 Other fatigue: Secondary | ICD-10-CM

## 2016-06-28 DIAGNOSIS — N529 Male erectile dysfunction, unspecified: Secondary | ICD-10-CM | POA: Diagnosis not present

## 2016-06-28 NOTE — Progress Notes (Signed)
Terry Liu  MRN: [id number] DOB: Mar 09, 1955  Subjective:  HPI  Patient is here for follow up. Last office visit was on 12/31/15 for CPE. Routine labs were done at that time and were ok. Wt Readings from Last 3 Encounters:  06/28/16 298 lb (135.2 kg)  12/31/15 289 lb (131.1 kg)  09/17/15 287 lb (130.2 kg)   DVT: he is taking Eliquis twice daily. He is having some numbness in the left thigh for a while now, and burning in his feet at night. His last Glucose level was checked in November 2017 at 90. He also would like to discuss if he should be seen cardiologist for follow up. He thinks maybe he has seen cardiologist in the past but he is not sure. BP Readings from Last 3 Encounters:  06/28/16 122/84  12/31/15 112/60  09/17/15 112/74    Patient Active Problem List   Diagnosis Date Noted  . Elevated prolactin level ([place]) 09/25/2015  . Gynecomastia, male 12/31/2014  . Closed head injury 09/19/2014  . GERD (gastroesophageal reflux disease) 09/19/2014  . ED (erectile dysfunction) 09/19/2014  . Obesity 09/19/2014  . Umbilical hernia without obstruction or gangrene 09/19/2014  . Hemorrhoids, internal 09/19/2014  . 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 Diverticulitis large intestine w/o perforation or abscess w/o bleeding 09/19/2014  . Fluttering heart 09/19/2014  . Thrombophlebitis leg 09/19/2014  . Hematoma 09/19/2014  . Osteoarthritis 09/19/2014  . Hyperlipidemia 09/19/2014  . DVT (deep venous thrombosis) ([place]) 09/19/2014  . Sleep apnea 09/19/2014  . Snoring 09/19/2014  . Dermatitis, eczematoid 09/19/2014    Past Medical History:  Diagnosis Date  . Arthritis    Osteoarthritis  . DVT of leg (deep venous thrombosis) ([place]) 2013   LLE/Dr [place]da [place]  . GERD (gastroesophageal reflux disease)   . Hyperlipidemia     Social History   Social History  . Marital status: Married    Spouse name: N/A  . Number of children: N/A  . Years of education: N/A   Occupational History  . Not on  file.   Social History Main Topics  . Smoking status: Never Smoker  . Smokeless tobacco: Never Used  . Alcohol use No  . Drug use: No  . Sexual activity: Not on file   Other Topics Concern  . Not on file   Social History Narrative  . No narrative on file    Outpatient Encounter Prescriptions as of 06/28/2016  Medication Sig  . cholecalciferol (VITAMIN D) 1000 units tablet Take 1,000 Units by mouth daily.  [name] ELIQUIS 5 MG TABS tablet TAKE 1 TABLET BY MOUTH TWICE DAILY  . sildenafil (REVATIO) 20 MG tablet Take 1 tablet (20 mg total) by mouth 3 (three) times daily.   No facility-administered encounter medications on file as of 06/28/2016.     No Known Allergies  Review of Systems  Constitutional: Positive for malaise/fatigue.  Respiratory: Negative.   Cardiovascular: Negative.   Musculoskeletal: Positive for joint pain.       Stiffness  Neurological: Negative.        Numbness on the side of the left leg, and burning sensation In the feet.--mostly right foot,  Endo/Heme/Allergies: Negative.   Psychiatric/Behavioral: Negative.     Objective:  BP 122/84   Pulse 82   Temp 98.7 F (37.1 C)   Resp 14   Wt 298 lb (135.2 kg)   BMI 40.42 kg/m   Physical Exam  Constitutional: He is oriented to person, place, and time and well-developed, well-nourished,  and in no distress.  HENT:  Head: Normocephalic and atraumatic.  Right Ear: External ear normal.  Left Ear: External ear normal.  Nose: Nose normal.  Eyes: Conjunctivae are normal.  Neck: No thyromegaly present.  Cardiovascular: Normal rate, regular rhythm and normal heart sounds.   Pulmonary/Chest: Effort normal and breath sounds normal.  Abdominal: Soft.  Neurological: He is alert and oriented to person, place, and time.  Skin: Skin is warm and dry.  Psychiatric: Mood, memory, affect and judgment normal.    Assessment and Plan :  1. Chest tightness EKG stable- not in afib. Refer to cardiology for check up. - EKG  12-Lead - Ambulatory referral to Cardiology  2. Recurrent deep vein thrombosis (DVT) ([place]) - Ambulatory referral to Cardiology  3. Erectile dysfunction, unspecified erectile dysfunction type 4. Other fatigue Epworth score today is 13. Set up Home sleep study. - Ambulatory referral to Sleep Studies  HPI, Exam and A&P transcribed by Theressa Millard, RMA under direction and in the presence of Miguel Aschoff, MD. I have done the exam and reviewed the chart and it is accurate to the best of my knowledge. Development worker, community has been used and  any errors in dictation or transcription are unintentional. Miguel Aschoff M.D. [facility]

## 2016-08-18 ENCOUNTER — Telehealth: Payer: Self-pay

## 2016-08-18 NOTE — Telephone Encounter (Signed)
Patient is wanting to know if Dr. Rosanna Randy has received his sleep study results. He states he did the study on 7/12 and 7/13 at home. CB#[phone]

## 2016-08-18 NOTE — Telephone Encounter (Signed)
I have not seen anything come through and I do not see any reports in media. Have you seen it?-aa

## 2016-08-19 NOTE — Telephone Encounter (Signed)
no

## 2016-08-19 NOTE — Telephone Encounter (Signed)
Pt reports that the guy that did his sleep study told him it was severe. Will contact pt when we get results in on the next step for him.

## 2016-08-21 NOTE — Progress Notes (Signed)
Cardiology Office Note  Date:  08/23/2016   ID:  Terry Liu, DOB [birth year], MRN [id number]  PCP:  [name], MD   Chief Complaint  Patient presents with  . other    New patient. Patient c/o chest pain yesterday, heart skipping a beat and SOB.Referred by PCP. Meds reviewed verbally with patient.    HPI:  Terry Liu is a 61 year old gentleman with history of history of unprovoked DVT 2, full hypercoagulable workup was reported as negative on anticoagulation lifelong Hyperlipidemia Sleep apnea Obesity Chest tightness Pituitary adenoma/hyperprolactinemia Erectile dysfunction Nonsmoker, no diabtes,  Who presents by referral from Dr. Rosanna Randy for consultation of his DVT and chest pain  He reports having a long history of palpitations He has had these for years, seems to come and go, no rhyme or reason Sometimes when he has clusters of palpitations he has tightness in his chest Chronic baseline shortness of breath which she attributes to weight and deconditioning  He reports having sleep apnea, being fitted for CPAP He continues to work. 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  PSH:    Past Surgical History:  Procedure Laterality Date  . TONSILLECTOMY      Current Outpatient Prescriptions  Medication Sig Dispense Refill  . ELIQUIS 5 MG TABS tablet TAKE 1 TABLET  BY MOUTH TWICE DAILY 60 tablet 12  . metoprolol succinate (TOPROL XL) 25 MG 24 hr tablet Take 1 tablet (25 mg total) by mouth daily. 30 tablet 6   No current facility-administered medications for this visit.      Allergies:   Patient has no known allergies.   Social History:  The patient  reports that he has never smoked. He has never used smokeless tobacco. He reports that he does not drink alcohol or use drugs.   Family History:   family history includes Breast cancer (age of onset: 76) in his mother; Diabetes in his sister; Lung cancer in his father and mother; Melanoma in his sister.    Review of Systems: Review of Systems  Constitutional: Negative.   Respiratory: Negative.   Cardiovascular: Positive for chest pain and palpitations.  Gastrointestinal: Negative.   Musculoskeletal: Negative.   Neurological: Negative.   Psychiatric/Behavioral: Negative.   All other systems reviewed and are negative.    PHYSICAL EXAM: VS:  BP 126/90 (BP Location: Left Arm, Patient Position: Sitting, Cuff Size: Normal)   Pulse 81   Ht 6' (1.829 m)   Wt 299 lb 8 oz (135.9 kg)   BMI 40.62 kg/m  , BMI Body mass index is 40.62 kg/m. GEN: Well nourished, well developed, in no acute distress  HEENT: normal  Neck: no JVD, carotid bruits, or masses Cardiac: RRR; no murmurs, rubs, or gallops,no edema  Respiratory:  clear to auscultation bilaterally, normal work of breathing GI: soft, nontender, nondistended, + BS MS: no deformity or atrophy  Skin: warm and dry,  no rash Neuro:  Strength and sensation are intact Psych: euthymic mood, full affect    Recent Labs: 12/31/2015: ALT 23; BUN 15; Creatinine, Ser 1.09; Hemoglobin 14.3; Platelets 241; Potassium 4.9; Sodium 142; TSH 2.240    Lipid Panel Lab Results  Component Value Date   CHOL 199 12/31/2015   HDL 44 12/31/2015   LDLCALC 113 (H) 12/31/2015   TRIG 212 (H) 12/31/2015      Wt Readings from Last 3 Encounters:  08/23/16 299 lb 8 oz  (135.9 kg)  06/28/16 298 lb (135.2 kg)  12/31/15 289 lb (131.1 kg)       ASSESSMENT AND PLAN:  Chronic deep vein thrombosis (DVT) of proximal vein of lower extremity, unspecified laterality (HCC) -  Tolerating anticoagulation, lifelong  Mixed hyperlipidemia - Plan: EKG 12-Lead Discussed his cholesterol with him Minimal aortic plaque noted Recommended lifestyle modifications  Sleep apnea, unspecified type - Plan: EKG 12-Lead He reports having sleep apnea, possibly contributing to arrhythmia below Scheduled to be fitted for CPAP  Palpitations We suspected he was having PVCs, possibly APCs This was confirmed by running rhythm strip EKG in the office Frequent PVCs, rare APCs noted Recommended history metoprolol succinate 25 mg daily This medication can be titrated up slowly if tolerated If he continues to have symptomatic ectopy even on beta blocker, potentially could add antiarrhythmics but only if having significant symptoms  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 Rosanna Randy and will be referred back to his office for ongoing care of the issues detailed above    Orders Placed This Encounter  Procedures  . EKG 12-Lead     Signed, Esmond Plants, M.D., Ph.D. 08/23/2016  [place], [place]

## 2016-08-23 ENCOUNTER — Ambulatory Visit (INDEPENDENT_AMBULATORY_CARE_PROVIDER_SITE_OTHER): Payer: BLUE CROSS/BLUE SHIELD | Admitting: Cardiovascular Disease

## 2016-08-23 ENCOUNTER — Encounter: Payer: Self-pay | Admitting: Cardiovascular Disease

## 2016-08-23 VITALS — BP 126/90 | HR 81 | Ht 72.0 in | Wt 299.5 lb

## 2016-08-23 DIAGNOSIS — I491 Atrial premature depolarization: Secondary | ICD-10-CM | POA: Diagnosis not present

## 2016-08-23 DIAGNOSIS — I825Y9 Chronic embolism and thrombosis of unspecified deep veins of unspecified proximal lower extremity: Secondary | ICD-10-CM

## 2016-08-23 DIAGNOSIS — I493 Ventricular premature depolarization: Secondary | ICD-10-CM

## 2016-08-23 DIAGNOSIS — E782 Mixed hyperlipidemia: Secondary | ICD-10-CM | POA: Diagnosis not present

## 2016-08-23 DIAGNOSIS — R002 Palpitations: Secondary | ICD-10-CM

## 2016-08-23 DIAGNOSIS — G4730 Sleep apnea, unspecified: Secondary | ICD-10-CM | POA: Diagnosis not present

## 2016-08-23 MED ORDER — METOPROLOL SUCCINATE ER 25 MG PO TB24: 25.0000 mg | ORAL_TABLET | Freq: Every day | ORAL | 6 refills | Status: DC

## 2016-08-23 NOTE — Patient Instructions (Signed)
Medication Instructions:   Please start metoprolol one a day (day or night)  Labwork:  No new labs needed  Testing/Procedures:  No further testing at this time  Research CT coronary calcium score  Test in [place] for blockage   Follow-Up: It was a pleasure seeing you in the office today. Please call us if you have new issues that need to be addressed before your next appt.  [phone]  Your physician wants you to follow-up in:  As needed  If you need a refill on your cardiac medications before your next appointment, please call your pharmacy.

## 2016-09-02 ENCOUNTER — Telehealth: Payer: Self-pay | Admitting: Family Medicine

## 2016-09-02 NOTE — Telephone Encounter (Signed)
Pt is requesting the results of a home sleep study test.  Pt is asking about getting a CPAP.  CB#[phone]

## 2016-09-02 NOTE — Telephone Encounter (Signed)
Dr Rosanna Randy please review report. I placed it on green chart for you this week I believe. Thank you-aa

## 2016-09-05 ENCOUNTER — Other Ambulatory Visit: Payer: Self-pay

## 2016-09-05 DIAGNOSIS — G4733 Obstructive sleep apnea (adult) (pediatric): Secondary | ICD-10-CM

## 2016-09-06 ENCOUNTER — Encounter: Payer: Self-pay | Admitting: Family Medicine

## 2016-09-13 NOTE — Telephone Encounter (Signed)
Pt is going to be getting a c-pap through the mail and has not gotten the results back from the test yet.  The person who did the test told him he had severe mode sleep apnea.  Pt's call back is [phone]   Thanks teri

## 2016-09-13 NOTE — Telephone Encounter (Signed)
Spoke with patient and advised him that study came in and he needs CPAP titration, advised to contact the company and make sure that is what they sending him to use for CPA titration study. Advised patient this is a new company to [place] and I am not sure how they operate with their orders. Patient Terry Liu

## 2016-09-16 ENCOUNTER — Encounter: Payer: Self-pay | Admitting: Family Medicine

## 2016-09-21 ENCOUNTER — Ambulatory Visit (INDEPENDENT_AMBULATORY_CARE_PROVIDER_SITE_OTHER): Payer: BLUE CROSS/BLUE SHIELD | Admitting: Physician Assistant

## 2016-09-21 ENCOUNTER — Encounter: Payer: Self-pay | Admitting: Physician Assistant

## 2016-09-21 VITALS — BP 108/66 | HR 84 | Temp 97.9°F | Resp 16 | Wt 298.0 lb

## 2016-09-21 DIAGNOSIS — R05 Cough: Secondary | ICD-10-CM | POA: Diagnosis not present

## 2016-09-21 DIAGNOSIS — R059 Cough, unspecified: Secondary | ICD-10-CM

## 2016-09-21 MED ORDER — BENZONATATE 100 MG PO CAPS: 100.0000 mg | ORAL_CAPSULE | Freq: Three times a day (TID) | ORAL | 0 refills | Status: DC | PRN

## 2016-09-21 NOTE — Patient Instructions (Signed)

## 2016-09-21 NOTE — Progress Notes (Signed)
Patient: Terry Liu Male    DOB: 1955-05-28   61 y.o.   MRN: [id number] Visit Date: 09/21/2016  Today's Provider: Trinna Post, PA-C   Chief Complaint  Patient presents with  . Cough    Has been going on since 09/02/2016  . URI   Subjective:      Had a cold on 09/01/2016 that resolved but now with cough.   Cough  This is a new problem. The current episode started 1 to 4 weeks ago. The problem has been unchanged. The cough is non-productive. Associated symptoms include postnasal drip. Pertinent negatives include no ear congestion, ear pain, fever, headaches, heartburn, nasal congestion, rash, rhinorrhea, sore throat, shortness of breath or wheezing. The symptoms are aggravated by cold air. He has tried OTC cough suppressant for the symptoms. The treatment provided mild relief.  URI   This is a new problem. The current episode started 1 to 4 weeks ago. The problem has been gradually improving. There has been no fever. Associated symptoms include coughing. Pertinent negatives include no congestion, ear pain, headaches, rash, rhinorrhea, sinus pain, sneezing, sore throat or wheezing.       No Known Allergies   Current Outpatient Prescriptions:  .  ELIQUIS 5 MG TABS tablet, TAKE 1 TABLET BY MOUTH TWICE DAILY, Disp: 60 tablet, Rfl: 12 .  metoprolol succinate (TOPROL XL) 25 MG 24 hr tablet, Take 1 tablet (25 mg total) by mouth daily., Disp: 30 tablet, Rfl: 6  Review of Systems  Constitutional: Negative.  Negative for fever.  HENT: Positive for postnasal drip. Negative for congestion, ear discharge, ear pain, mouth sores, nosebleeds, rhinorrhea, sinus pain, sinus pressure, sneezing, sore throat, trouble swallowing and voice change.   Respiratory: Positive for cough. Negative for apnea, choking, chest tightness, shortness of breath, wheezing and stridor.   Cardiovascular: Negative.   Gastrointestinal: Negative.  Negative for heartburn.  Skin: Negative for rash.    Neurological: Negative for dizziness, light-headedness and headaches.    Social History  Substance Use Topics  . Smoking status: Never Smoker  . Smokeless tobacco: Never Used  . Alcohol use No   Objective:   BP 108/66 (BP Location: Left Arm, Patient Position: Sitting, Cuff Size: Large)   Pulse 84   Temp 97.9 F (36.6 C) (Oral)   Resp 16   Wt 298 lb (135.2 kg)   SpO2 96%   BMI 40.42 kg/m  Vitals:   09/21/16 1330  BP: 108/66  Pulse: 84  Resp: 16  Temp: 97.9 F (36.6 C)  TempSrc: Oral  SpO2: 96%  Weight: 298 lb (135.2 kg)     Physical Exam  Constitutional: He is oriented to person, place, and time. He appears well-developed and well-nourished.  HENT:  Right Ear: External ear normal.  Left Ear: External ear normal.  Mouth/Throat: Oropharynx is clear and moist. No oropharyngeal exudate.  Eyes: Conjunctivae are normal.  Neck: Neck supple.  Cardiovascular: Normal rate and regular rhythm.   Pulmonary/Chest: Effort normal and breath sounds normal. No respiratory distress. He has no wheezes. He has no rales. He exhibits no tenderness.  Lymphadenopathy:    He has no cervical adenopathy.  Neurological: He is alert and oriented to person, place, and time.  Skin: Skin is warm and dry.  Psychiatric: He has a normal mood and affect. His behavior is normal.        Assessment & Plan:     1. Cough  Appears to be post  infectious. Will treat symptomatically, should resolve in a week or so.   - benzonatate (TESSALON PERLES) 100 MG capsule; Take 1 capsule (100 mg total) by mouth 3 (three) times daily as needed for cough.  Dispense: 21 capsule; Refill: 0  Return if symptoms worsen or fail to improve.  The entirety of the information documented in the History of Present Illness, Review of Systems and Physical Exam were personally obtained by me. Portions of this information were initially documented by Ashley Royalty, CMA and reviewed by me for thoroughness and accuracy.         Trinna Post, PA-C  [facility]

## 2016-10-17 ENCOUNTER — Other Ambulatory Visit: Payer: Self-pay | Admitting: Family Medicine

## 2016-11-10 ENCOUNTER — Ambulatory Visit: Payer: BLUE CROSS/BLUE SHIELD

## 2016-11-18 ENCOUNTER — Encounter: Payer: Self-pay | Admitting: Emergency Medicine

## 2016-11-18 ENCOUNTER — Emergency Department
Admission: EM | Admit: 2016-11-18 | Discharge: 2016-11-18 | Disposition: A | Discharge status: Home / Self Care | Payer: BLUE CROSS/BLUE SHIELD | Attending: Emergency Medicine | Admitting: Emergency Medicine

## 2016-11-18 DIAGNOSIS — Z2914 Encounter for prophylactic rabies immune globin: Secondary | ICD-10-CM | POA: Diagnosis not present

## 2016-11-18 DIAGNOSIS — Z79899 Other long term (current) drug therapy: Secondary | ICD-10-CM | POA: Insufficient documentation

## 2016-11-18 DIAGNOSIS — Z23 Encounter for immunization: Secondary | ICD-10-CM | POA: Diagnosis not present

## 2016-11-18 DIAGNOSIS — Z7901 Long term (current) use of anticoagulants: Secondary | ICD-10-CM | POA: Diagnosis not present

## 2016-11-18 DIAGNOSIS — Z203 Contact with and (suspected) exposure to rabies: Secondary | ICD-10-CM | POA: Diagnosis present

## 2016-11-18 MED ORDER — RABIES VACCINE, PCEC IM SUSR
1.0000 mL | Freq: Once | INTRAMUSCULAR | Status: AC
  Administered 2016-11-18: 1 mL via INTRAMUSCULAR
  Filled 2016-11-18: qty 1

## 2016-11-18 MED ORDER — RABIES IMMUNE GLOBULIN 150 UNIT/ML IM INJ
20.0000 [IU]/kg | INJECTION | Freq: Once | INTRAMUSCULAR | Status: AC
  Administered 2016-11-18: 2775 [IU] via INTRAMUSCULAR
  Filled 2016-11-18: qty 18.5

## 2016-11-18 NOTE — ED Triage Notes (Signed)
Possible exposure to a bat.

## 2016-11-18 NOTE — ED Notes (Signed)
Pt ambulatory with steady gait to POV with wife. Skin PWD. VSS. NAD. No questions or concerns voiced at discharge.

## 2016-11-18 NOTE — Discharge Instructions (Signed)
Return to the emergency room or [place] urgent care for remaining immunizations.  Return on October 10/22, 10/26 and Novermber 2.

## 2016-11-18 NOTE — ED Provider Notes (Signed)
[facility] Emergency Department Provider Note  ____________________________________________   First MD Initiated Contact with Patient 11/18/16 1516     (approximate)  I have reviewed the triage vital signs and the nursing notes.   HISTORY  Chief Complaint Rabies Injection   HPI DEARL RUDDEN is a 61 y.o. male  is here with wife after being exposed to a bat. patientstates they're watching TV when the bat began flying around the house and then went down the hallway. Their dog cornered it and killed it on the carpet and then came over to the patient and began licking him. Patient was called today multiple agencies to report that the bat tested positive for rabies. They were instructed to come to the emergency department for rabies vaccinations.  Family has no idea how long the bat had  been in the house.  Past Medical History:  Diagnosis Date  . Arthritis    Osteoarthritis  . DVT of leg (deep venous thrombosis) ([place]) 2013   LLE/Dr Cynda Acres  . GERD (gastroesophageal reflux disease)   . Hyperlipidemia     Patient Active Problem List   Diagnosis Date Noted  . 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 PAC (premature atrial contraction) 08/23/2016  . Elevated prolactin level ([place]) 09/25/2015  . Gynecomastia, male 12/31/2014  . Closed head injury 09/19/2014  . GERD (gastroesophageal reflux disease) 09/19/2014  . ED (erectile dysfunction) 09/19/2014  . Obesity 09/19/2014  . Umbilical hernia without obstruction or gangrene 09/19/2014  . Hemorrhoids, internal 09/19/2014  . 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 Diverticulitis large intestine w/o perforation or abscess w/o bleeding 09/19/2014  . Palpitations 09/19/2014  . Thrombophlebitis leg 09/19/2014  . Hematoma 09/19/2014  . Osteoarthritis 09/19/2014  . Hyperlipidemia 09/19/2014  . DVT (deep venous thrombosis) ([place]) 09/19/2014  . Sleep apnea 09/19/2014  . Snoring 09/19/2014  . Dermatitis, eczematoid 09/19/2014    Past  Surgical History:  Procedure Laterality Date  . TONSILLECTOMY      Prior to Admission medications   Medication Sig Start Date End Date Taking? Authorizing Provider  benzonatate (TESSALON PERLES) 100 MG capsule Take 1 capsule (100 mg total) by mouth 3 (three) times daily as needed for cough. 09/21/16   Trinna Post, PA-C  ELIQUIS 5 MG TABS tablet TAKE 1 TABLET BY MOUTH TWICE DAILY 10/17/16   [name], MD  metoprolol succinate (TOPROL XL) 25 MG 24 hr tablet Take 1 tablet (25 mg total) by mouth daily. 08/23/16   Minna Merritts, MD    Allergies Patient has no known allergies.  Family History  Problem Relation Age of Onset  . Lung cancer Mother   . Breast cancer Mother 48  . Lung cancer Father   . Diabetes Sister   . Melanoma Sister        benign breast tumor    Social History Social History  Substance Use Topics  . Smoking status: Never Smoker  . Smokeless tobacco: Never Used  . Alcohol use No    Review of Systems Constitutional: No fever/chills Cardiovascular: Denies chest pain. Respiratory: Denies shortness of breath. Gastrointestinal:  No nausea, no vomiting.  Musculoskeletal: Negative for back pain. Skin: Negative for rash. Neurological: Negative for  focal weakness or numbness. ____________________________________________   PHYSICAL EXAM:  VITAL SIGNS: ED Triage Vitals  Enc Vitals Group     BP      Pulse      Resp      Temp  Temp src      SpO2      Weight      Height      Head Circumference      Peak Flow      Pain Score      Pain Loc      Pain Edu?      Excl. in [place]?    Constitutional: Alert and oriented. Well appearing and in no acute distress. Eyes: Conjunctivae are normal.  Head: Atraumatic. Neck: No stridor.   Cardiovascular: Normal rate, regular rhythm. Grossly normal heart sounds.  Good peripheral circulation. Respiratory: Normal respiratory effort.  No retractions. Lungs CTAB. Musculoskeletal: his upper and lower  extremities pain difficulty. Normal gait was noted. Neurologic:  Normal speech and language. No gross focal neurologic deficits are appreciated.  Skin:  Skin is warm, dry and intact.  Psychiatric: Mood and affect are normal. Speech and behavior are normal.  ____________________________________________   LABS (all labs ordered are listed, but only abnormal results are displayed)  Labs Reviewed - No data to display  PROCEDURES  Procedure(s) performed: None  Procedures  Critical Care performed: No  ____________________________________________   INITIAL IMPRESSION / ASSESSMENT AND PLAN / ED COURSE  Patient began on rabies immunization. We discussed with other family member and especially after recommendations from agencies on confirmed rabies. All questions were answered. Patient also consider going to [facility] for remainder of his injections.   __________________________________________   FINAL CLINICAL IMPRESSION(S) / ED DIAGNOSES  Final diagnoses:  Rabies exposure      NEW MEDICATIONS STARTED DURING THIS VISIT:  New Prescriptions   No medications on file     Note:  This document was prepared using Dragon voice recognition software and may include unintentional dictation errors.    Johnn Hai, PA-C 11/18/16 1736    Harvest Dark, MD 11/21/16 1549

## 2016-11-19 ENCOUNTER — Ambulatory Visit: Payer: BLUE CROSS/BLUE SHIELD

## 2016-11-21 ENCOUNTER — Ambulatory Visit
Admission: EM | Admit: 2016-11-21 | Discharge: 2016-11-21 | Disposition: A | Discharge status: Home / Self Care | Payer: BLUE CROSS/BLUE SHIELD | Attending: Family Medicine | Admitting: Family Medicine

## 2016-11-21 DIAGNOSIS — Z203 Contact with and (suspected) exposure to rabies: Secondary | ICD-10-CM

## 2016-11-21 DIAGNOSIS — Z23 Encounter for immunization: Secondary | ICD-10-CM | POA: Diagnosis not present

## 2016-11-21 MED ORDER — RABIES VACCINE, PCEC IM SUSR
1.0000 mL | Freq: Once | INTRAMUSCULAR | Status: AC
  Administered 2016-11-21: 1 mL via INTRAMUSCULAR

## 2016-11-21 NOTE — ED Notes (Signed)
Here for second rabies shot.

## 2016-11-25 ENCOUNTER — Ambulatory Visit
Admission: EM | Admit: 2016-11-25 | Discharge: 2016-11-25 | Disposition: A | Discharge status: Home / Self Care | Payer: BLUE CROSS/BLUE SHIELD | Attending: Family Medicine | Admitting: Family Medicine

## 2016-11-25 ENCOUNTER — Encounter: Payer: Self-pay | Admitting: Emergency Medicine

## 2016-11-25 DIAGNOSIS — Z23 Encounter for immunization: Secondary | ICD-10-CM

## 2016-11-25 DIAGNOSIS — Z203 Contact with and (suspected) exposure to rabies: Secondary | ICD-10-CM

## 2016-11-25 MED ORDER — RABIES VACCINE, PCEC IM SUSR
1.0000 mL | Freq: Once | INTRAMUSCULAR | Status: AC
  Administered 2016-11-25: 1 mL via INTRAMUSCULAR

## 2016-11-25 NOTE — ED Triage Notes (Signed)
Patient in today for his 3rd Rabies Vaccine.

## 2016-11-30 ENCOUNTER — Ambulatory Visit: Payer: Self-pay

## 2016-12-02 ENCOUNTER — Ambulatory Visit
Admission: EM | Admit: 2016-12-02 | Discharge: 2016-12-02 | Disposition: A | Discharge status: Home / Self Care | Payer: BLUE CROSS/BLUE SHIELD | Attending: Family Medicine | Admitting: Family Medicine

## 2016-12-02 ENCOUNTER — Encounter: Payer: Self-pay | Admitting: *Deleted

## 2016-12-02 DIAGNOSIS — Z203 Contact with and (suspected) exposure to rabies: Secondary | ICD-10-CM | POA: Diagnosis not present

## 2016-12-02 DIAGNOSIS — Z23 Encounter for immunization: Secondary | ICD-10-CM | POA: Diagnosis not present

## 2016-12-02 MED ORDER — RABIES VACCINE, PCEC IM SUSR
1.0000 mL | Freq: Once | INTRAMUSCULAR | Status: AC
  Administered 2016-12-02: 1 mL via INTRAMUSCULAR

## 2016-12-02 NOTE — ED Triage Notes (Signed)
Pt here for 4th Rabies shot.

## 2016-12-14 ENCOUNTER — Other Ambulatory Visit: Payer: Self-pay

## 2016-12-14 ENCOUNTER — Ambulatory Visit: Payer: BLUE CROSS/BLUE SHIELD | Admitting: Family Medicine

## 2016-12-14 ENCOUNTER — Encounter: Payer: Self-pay | Admitting: Family Medicine

## 2016-12-14 VITALS — BP 112/64 | HR 72 | Temp 98.2°F | Resp 16 | Wt 305.0 lb

## 2016-12-14 DIAGNOSIS — K429 Umbilical hernia without obstruction or gangrene: Secondary | ICD-10-CM | POA: Diagnosis not present

## 2016-12-14 DIAGNOSIS — E782 Mixed hyperlipidemia: Secondary | ICD-10-CM | POA: Diagnosis not present

## 2016-12-14 DIAGNOSIS — I491 Atrial premature depolarization: Secondary | ICD-10-CM | POA: Diagnosis not present

## 2016-12-14 DIAGNOSIS — G4730 Sleep apnea, unspecified: Secondary | ICD-10-CM

## 2016-12-14 DIAGNOSIS — I493 Ventricular premature depolarization: Secondary | ICD-10-CM | POA: Diagnosis not present

## 2016-12-14 DIAGNOSIS — K219 Gastro-esophageal reflux disease without esophagitis: Secondary | ICD-10-CM

## 2016-12-14 DIAGNOSIS — Z125 Encounter for screening for malignant neoplasm of prostate: Secondary | ICD-10-CM

## 2016-12-14 LAB — CBC WITH DIFFERENTIAL/PLATELET
BASOS ABS: 32 {cells}/uL (ref 0–200)
Basophils Relative: 0.6 %
EOS ABS: 130 {cells}/uL (ref 15–500)
Eosinophils Relative: 2.4 %
HCT: 43.7 % (ref 38.5–50.0)
HEMOGLOBIN: 14.6 g/dL (ref 13.2–17.1)
Lymphs Abs: 1690 cells/uL (ref 850–3900)
MCH: 29 pg (ref 27.0–33.0)
MCHC: 33.4 g/dL (ref 32.0–36.0)
MCV: 86.9 fL (ref 80.0–100.0)
MPV: 10.5 fL (ref 7.5–12.5)
Monocytes Relative: 10.4 %
NEUTROS ABS: 2986 {cells}/uL (ref 1500–7800)
Neutrophils Relative %: 55.3 %
Platelets: 271 10*3/uL (ref 140–400)
RBC: 5.03 10*6/uL (ref 4.20–5.80)
RDW: 12.2 % (ref 11.0–15.0)
TOTAL LYMPHOCYTE: 31.3 %
WBC mixed population: 562 cells/uL (ref 200–950)
WBC: 5.4 10*3/uL (ref 3.8–10.8)

## 2016-12-14 LAB — COMPLETE METABOLIC PANEL WITH GFR
AG RATIO: 1.9 (calc) (ref 1.0–2.5)
ALBUMIN MSPROF: 4.6 g/dL (ref 3.6–5.1)
ALT: 26 U/L (ref 9–46)
AST: 11 U/L (ref 10–35)
Alkaline phosphatase (APISO): 76 U/L (ref 40–115)
BUN: 17 mg/dL (ref 7–25)
CALCIUM: 9.7 mg/dL (ref 8.6–10.3)
CO2: 29 mmol/L (ref 20–32)
Chloride: 103 mmol/L (ref 98–110)
Creat: 1.02 mg/dL (ref 0.70–1.25)
GFR, EST AFRICAN AMERICAN: 91 mL/min/{1.73_m2} (ref >=60)
GFR, EST NON AFRICAN AMERICAN: 78 mL/min/{1.73_m2} (ref >=60)
GLOBULIN: 2.4 g/dL (ref 1.9–3.7)
Glucose, Bld: 94 mg/dL (ref 65–99)
POTASSIUM: 4.2 mmol/L (ref 3.5–5.3)
SODIUM: 140 mmol/L (ref 135–146)
TOTAL PROTEIN: 7 g/dL (ref 6.1–8.1)
Total Bilirubin: 0.7 mg/dL (ref 0.2–1.2)

## 2016-12-14 LAB — LIPID PANEL
CHOL/HDL RATIO: 3.9 (calc) (ref <5.0)
Cholesterol: 188 mg/dL (ref <200)
HDL: 48 mg/dL (ref >40)
LDL CHOLESTEROL (CALC): 115 mg/dL — AB
Non-HDL Cholesterol (Calc): 140 mg/dL (calc) — ABNORMAL HIGH (ref <130)
TRIGLYCERIDES: 132 mg/dL (ref <150)

## 2016-12-14 LAB — TSH: TSH: 3.13 m[IU]/L (ref 0.40–4.50)

## 2016-12-14 LAB — PSA: PSA: 0.6 ng/mL (ref <=4.0)

## 2016-12-14 NOTE — Progress Notes (Signed)
Terry Liu  MRN: [id number] DOB: December 04, 1955  Subjective:  HPI   The patient is a 61 year old male who presents for follow up of his chronic issues.   His last visit was on 09/21/16 for an acute issue.    Hyperlipidemia- Patient is not currently on medication for cholesterol.  His last lipids were done in Nov 2017, he is due for testing today.    GERD-Patient has history of acid reflux but is not currently taking any medication.  DVT- Patient has history of DVT and is currently still on Eliquis.    Patient Active Problem List   Diagnosis Date Noted  . 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 PAC (premature atrial contraction) 08/23/2016  . Elevated prolactin level ([place]) 09/25/2015  . Gynecomastia, male 12/31/2014  . Closed head injury 09/19/2014  . GERD (gastroesophageal reflux disease) 09/19/2014  . ED (erectile dysfunction) 09/19/2014  . Obesity 09/19/2014  . Umbilical hernia without obstruction or gangrene 09/19/2014  . Hemorrhoids, internal 09/19/2014  . 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 Diverticulitis large intestine w/o perforation or abscess w/o bleeding 09/19/2014  . Palpitations 09/19/2014  . Thrombophlebitis leg 09/19/2014  . Hematoma 09/19/2014  . Osteoarthritis 09/19/2014  . Hyperlipidemia 09/19/2014  . DVT (deep venous thrombosis) ([place]) 09/19/2014  . Sleep apnea 09/19/2014  . Snoring 09/19/2014  . Dermatitis, eczematoid 09/19/2014    Past Medical History:  Diagnosis Date  . Arthritis    Osteoarthritis  . DVT of leg (deep venous thrombosis) ([place]) 2013   LLE/Dr Cynda Acres  . GERD (gastroesophageal reflux disease)   . Hyperlipidemia     Social History   Socioeconomic History  . Marital status: Married    Spouse name: Not on file  . Number of children: Not on file  . Years of education: Not on file  . Highest education level: Not on file  Social Needs  . Financial resource strain: Not on file  . Food insecurity - worry: Not on file  . Food insecurity - inability: Not  on file  . Transportation needs - medical: Not on file  . Transportation needs - non-medical: Not on file  Occupational History  . Not on file  Tobacco Use  . Smoking status: Never Smoker  . Smokeless tobacco: Never Used  Substance and Sexual Activity  . Alcohol use: No  . Drug use: No  . Sexual activity: Not on file  Other Topics Concern  . Not on file  Social History Narrative  . Not on file    Outpatient Encounter Medications as of 12/14/2016  Medication Sig  . ELIQUIS 5 MG TABS tablet TAKE 1 TABLET BY MOUTH TWICE DAILY  . metoprolol succinate (TOPROL XL) 25 MG 24 hr tablet Take 1 tablet (25 mg total) by mouth daily.  . [DISCONTINUED] benzonatate (TESSALON PERLES) 100 MG capsule Take 1 capsule (100 mg total) by mouth 3 (three) times daily as needed for cough.   No facility-administered encounter medications on file as of 12/14/2016.     No Known Allergies  Review of Systems  Constitutional: Negative for fever and malaise/fatigue.  Eyes: Negative.   Respiratory: Negative for cough, shortness of breath and wheezing.   Cardiovascular: Negative for chest pain, palpitations, orthopnea, claudication and leg swelling.  Gastrointestinal: Negative.   Musculoskeletal: Positive for joint pain.  Neurological: Negative.  Negative for weakness.  Endo/Heme/Allergies: Negative.   Psychiatric/Behavioral: Negative.     Objective:  BP 112/64 (BP Location: Right Arm, Patient Position: Sitting, Cuff Size: Normal)  Pulse 72   Temp 98.2 F (36.8 C) (Oral)   Resp 16   Wt (!) 305 lb (138.3 kg)   BMI 41.37 kg/m   Physical Exam  Constitutional: He is oriented to person, place, and time and well-developed, well-nourished, and in no distress.  Obese WM NAD.  HENT:  Head: Normocephalic and atraumatic.  Eyes: Pupils are equal, round, and reactive to light. No scleral icterus.  Neck: Normal range of motion. No thyromegaly present.  Cardiovascular: Normal rate, regular rhythm and normal  heart sounds.  Pulmonary/Chest: Effort normal and breath sounds normal.  Abdominal: Soft. Bowel sounds are normal.  Small umbilical hernia present.  Neurological: He is alert and oriented to person, place, and time.  Skin: Skin is warm and dry.  Psychiatric: Mood, memory, affect and judgment normal.    Assessment and Plan :   1. Mixed hyperlipidemia  - CBC with Differential/Platelet - TSH - Lipid panel - COMPLETE METABOLIC PANEL WITH GFR  2. PAC (premature atrial contraction)   3. Sleep apnea, unspecified type   4. Frequent PVCs   5. Gastroesophageal reflux disease, esophagitis presence not specified   6. Prostate cancer screening  - PSA  7. Morbid obesity ([place])  - TSH - Lipid panel - COMPLETE METABOLIC PANEL WITH GFR  8. Umbilical hernia without obstruction and without gangrene  - Ambulatory referral to General Surgery 9.DVT On Eliquis.  HPI, Exam and A&P Transcribed under the direction and in the presence of Miguel Aschoff, Brooke Bonito., MD. Electronically Signed: Althea Charon, RMA I have done the exam and reviewed the chart and it is accurate to the best of my knowledge. Development worker, community has been used and  any errors in dictation or transcription are unintentional. Miguel Aschoff M.D. [facility]

## 2016-12-28 ENCOUNTER — Encounter: Payer: Self-pay | Admitting: General Surgery

## 2016-12-28 ENCOUNTER — Ambulatory Visit: Payer: BLUE CROSS/BLUE SHIELD | Admitting: General Surgery

## 2016-12-28 VITALS — BP 130/70 | HR 72 | Resp 14 | Ht 71.0 in | Wt 306.0 lb

## 2016-12-28 DIAGNOSIS — Z09 Encounter for follow-up examination after completed treatment for conditions other than malignant neoplasm: Secondary | ICD-10-CM

## 2016-12-28 DIAGNOSIS — K429 Umbilical hernia without obstruction or gangrene: Secondary | ICD-10-CM

## 2016-12-28 DIAGNOSIS — Z86718 Personal history of other venous thrombosis and embolism: Secondary | ICD-10-CM

## 2016-12-28 NOTE — Patient Instructions (Signed)
Umbilical Hernia, Adult A hernia is a bulge of tissue that pushes through an opening between muscles. An umbilical hernia happens in the abdomen, near the belly button (umbilicus). The hernia may contain tissues from the small intestine, large intestine, or fatty tissue covering the intestines (omentum). Umbilical hernias in adults tend to get worse over time, and they require surgical treatment. There are several types of umbilical hernias. You may have:  A hernia located just above or below the umbilicus (indirect hernia). This is the most common type of umbilical hernia in adults.  A hernia that forms through an opening formed by the umbilicus (direct hernia).  A hernia that comes and goes (reducible hernia). A reducible hernia may be visible only when you strain, lift something heavy, or cough. This type of hernia can be pushed back into the abdomen (reduced).  A hernia that traps abdominal tissue inside the hernia (incarcerated hernia). This type of hernia cannot be reduced.  A hernia that cuts off blood flow to the tissues inside the hernia (strangulated hernia). The tissues can start to die if this happens. This type of hernia requires emergency treatment.  What are the causes? An umbilical hernia happens when tissue inside the abdomen presses on a weak area of the abdominal muscles. What increases the risk? You may have a greater risk of this condition if you:  Are obese.  Have had several pregnancies.  Have a buildup of fluid inside your abdomen (ascites).  Have had surgery that weakens the abdominal muscles.  What are the signs or symptoms? The main symptom of this condition is a painless bulge at or near the belly button. A reducible hernia may be visible only when you strain, lift something heavy, or cough. Other symptoms may include:  Dull pain.  A feeling of pressure.  Symptoms of a strangulated hernia may include:  Pain that gets increasingly worse.  Nausea and  vomiting.  Pain when pressing on the hernia.  Skin over the hernia becoming red or purple.  Constipation.  Blood in the stool.  How is this diagnosed? This condition may be diagnosed based on:  A physical exam. You may be asked to cough or strain while standing. These actions increase the pressure inside your abdomen and force the hernia through the opening in your muscles. Your health care provider may try to reduce the hernia by pressing on it.  Your symptoms and medical history.  How is this treated? Surgery is the only treatment for an umbilical hernia. Surgery for a strangulated hernia is done as soon as possible. If you have a small hernia that is not incarcerated, you may need to lose weight before having surgery. Follow these instructions at home:  Lose weight, if told by your health care provider.  Do not try to push the hernia back in.  Watch your hernia for any changes in color or size. Tell your health care provider if any changes occur.  You may need to avoid activities that increase pressure on your hernia.  Do not lift anything that is heavier than 10 lb (4.5 kg) until your health care provider says that this is safe.  Take over-the-counter and prescription medicines only as told by your health care provider.  Keep all follow-up visits as told by your health care provider. This is important. Contact a health care provider if:  Your hernia gets larger.  Your hernia becomes painful. Get help right away if:  You develop sudden, severe pain near the   area of your hernia.  You have pain as well as nausea or vomiting.  You have pain and the skin over your hernia changes color.  You develop a fever. This information is not intended to replace advice given to you by your health care provider. Make sure you discuss any questions you have with your health care provider. Document Released: 06/19/2015 Document Revised: 09/20/2015 Document Reviewed:  06/19/2015 Elsevier Interactive Patient Education  2018 Elsevier Inc.  

## 2016-12-28 NOTE — Progress Notes (Signed)
Patient ID: Terry Liu, male   DOB: [birth year], 61 y.o.   MRN: [id number]  Chief Complaint  Patient presents with  . Umbilical Hernia    HPI Terry Liu is a 61 y.o. male here today for a evaluation of a umbilical hernia. Patient states he noticed this area about five years ago. The area has changed in size. No pain. Wife, Terry Liu is present at visit.  Patient seen Dr. Grayland Liu for DVT'S HPI  Past Medical History:  Diagnosis Date  . Arthritis    Osteoarthritis  . DVT of leg (deep venous thrombosis) ([place]) 2013   LLE/Dr Terry Liu  . GERD (gastroesophageal reflux disease)   . History of blood clots 2014  . Hyperlipidemia     Past Surgical History:  Procedure Laterality Date  . COLONOSCOPY  2010  . TONSILLECTOMY      Family History  Problem Relation Age of Onset  . Lung cancer Mother   . Breast cancer Mother 9  . Lung cancer Father   . Diabetes Sister   . Melanoma Sister        benign breast tumor    Social History Social History   Tobacco Use  . Smoking status: Never Smoker  . Smokeless tobacco: Never Used  Substance Use Topics  . Alcohol use: No  . Drug use: No    No Known Allergies  Current Outpatient Medications  Medication Sig Dispense Refill  . ELIQUIS 5 MG TABS tablet TAKE 1 TABLET BY MOUTH TWICE DAILY 60 tablet 12  . metoprolol succinate (TOPROL XL) 25 MG 24 hr tablet Take 1 tablet (25 mg total) by mouth daily. 30 tablet 6   No current facility-administered medications for this visit.     Review of Systems Review of Systems  Blood pressure 130/70, pulse 72, resp. rate 14, height 5\' 11"  (1.803 m), weight (!) 306 lb (138.8 kg).  Physical Exam Physical Exam  Constitutional: He is oriented to person, place, and time. He appears well-developed and well-nourished.  Cardiovascular: Normal rate, regular rhythm and normal heart sounds.  Pulmonary/Chest: Effort normal and breath sounds normal.  Abdominal: Soft. Normal appearance and bowel sounds are  normal. There is no tenderness. A hernia (umbilical hernia 2cm defect) is present.    Musculoskeletal:       Legs: Neurological: He is alert and oriented to person, place, and time.  Skin: Skin is warm and dry.      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  This occurred on Coumadin started 2 years earlier after his first episode of a primary left lower extremity DVT.  INR was 2.4 at the time of his recurrent DVT.  The patient was last evaluated by hematology on August 04, 2015.  Summary at that time reports that hypercoagulable workup was negative.  Dr. Gary Liu recommendation was to continue lifelong anticoagulation.  CT scan of the abdomen and pelvis dated October 25, 2013 for left upper quadrant pain was reviewed.  This showed a 2.6 x 3.2 cm fascial defect at the umbilicus with fatty tissue within. Findings at that time were consistent with fluid around the left colon and colonic diverticuli suggestive of diverticulitis.  No evidence of abscess.  Multiple hepatic cysts.  Evidence of hepatic steatosis.  Colonoscopy dated March 18, 2008 was reported as normal in the EMR.  Completed by Terry Liu.  Assessment    Umbilical hernia  2 cm by clinical exam, 3 cm by CT.  Candidate for elective repair if desired.  Likely candidate for mesh repair.    Past history diverticulitis, resolved.  Presently asymptomatic in this regard.  Most recent colonoscopy 2010.  Past history gynecomastia, asymptomatic.  Plan    Pros and cons of elective repair were discussed.  As he is on lifelong anticoagulation it would be prudent to undertake an elective procedure rather than an emergent one requiring surgery while anticoagulated.  Ultrasound examination of the left lower extremities been suggested to determine if there is any evidence of residual clot that would put him at high risk for recurrent  DVT.  The patient likely can be managed without bridging therapy, although this can be considered.  If no clot is identified on ultrasound would plan to start the patient on an 81 mg aspirin 1 week prior to surgery and discontinue Eliquis 2 days prior to surgery.  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    Hernia precautions and incarceration were discussed with the patient. If they develop symptoms of an incarcerated hernia, they were encouraged to seek prompt medical attention.  I have recommended repair of the hernia, possibly using mesh on an outpatient basis in the near future. The risk of infection was reviewed. The role of prosthetic mesh to minimize the risk of recurrence was reviewed.  Patient will call to scheduled surgery in March 2019. Patient to stop Eliquis two days before surgery and take a aspirin a week before surgery.  The patient was placed on Eliquis at that time which he continues to the present.  HPI, Physical Exam, Assessment and Plan have been scribed under the direction and in the presence of Terry Ard, MD.  Terry Liu, CMA  I have completed the exam and reviewed the above documentation for accuracy and completeness.  I agree with the above.  Haematologist has been used and any errors in dictation or transcription are unintentional.  Terry Liu, M.D., F.A.C.S.   Terry Liu 12/28/2016, 11:47 AM  Patient has been scheduled for a lower extremity ultrasound at [facility] for 01-04-17 at 11 am (arrive 10:45 am). Prep: none.  Terry Liu, CMA

## 2017-01-02 ENCOUNTER — Ambulatory Visit (INDEPENDENT_AMBULATORY_CARE_PROVIDER_SITE_OTHER): Payer: BLUE CROSS/BLUE SHIELD | Admitting: Family Medicine

## 2017-01-02 DIAGNOSIS — Z23 Encounter for immunization: Secondary | ICD-10-CM

## 2017-01-04 ENCOUNTER — Ambulatory Visit
Admission: RE | Admit: 2017-01-04 | Discharge: 2017-01-04 | Disposition: A | Discharge status: Home / Self Care | Payer: BLUE CROSS/BLUE SHIELD | Source: Ambulatory Visit | Attending: General Surgery | Admitting: General Surgery

## 2017-01-04 DIAGNOSIS — Z86718 Personal history of other venous thrombosis and embolism: Secondary | ICD-10-CM | POA: Diagnosis present

## 2017-01-04 DIAGNOSIS — Z09 Encounter for follow-up examination after completed treatment for conditions other than malignant neoplasm: Secondary | ICD-10-CM | POA: Diagnosis present

## 2017-03-15 ENCOUNTER — Other Ambulatory Visit: Payer: Self-pay | Admitting: Cardiovascular Disease

## 2017-03-16 ENCOUNTER — Encounter: Payer: Self-pay | Admitting: Family Medicine

## 2017-03-16 ENCOUNTER — Ambulatory Visit (INDEPENDENT_AMBULATORY_CARE_PROVIDER_SITE_OTHER): Payer: BLUE CROSS/BLUE SHIELD | Admitting: Family Medicine

## 2017-03-16 VITALS — BP 118/72 | HR 64 | Temp 97.9°F | Resp 16 | Ht 72.0 in | Wt 291.0 lb

## 2017-03-16 DIAGNOSIS — Z1211 Encounter for screening for malignant neoplasm of colon: Secondary | ICD-10-CM | POA: Diagnosis not present

## 2017-03-16 DIAGNOSIS — D352 Benign neoplasm of pituitary gland: Secondary | ICD-10-CM | POA: Diagnosis not present

## 2017-03-16 DIAGNOSIS — Z0000 Encounter for general adult medical examination without abnormal findings: Secondary | ICD-10-CM

## 2017-03-16 LAB — POCT URINALYSIS DIPSTICK
BILIRUBIN UA: NEGATIVE
Glucose, UA: NEGATIVE
KETONES UA: NEGATIVE
Leukocytes, UA: NEGATIVE
Nitrite, UA: NEGATIVE
Protein, UA: NEGATIVE
RBC UA: NEGATIVE
SPEC GRAV UA: 1.02 (ref 1.010–1.025)
UROBILINOGEN UA: 0.2 U/dL
pH, UA: 6.5 (ref 5.0–8.0)

## 2017-03-16 LAB — IFOBT (OCCULT BLOOD): IFOBT: NEGATIVE

## 2017-03-16 NOTE — Progress Notes (Signed)
Patient: Terry Liu, Male    DOB: Jan 12, 1956, 62 y.o.   MRN: [id number] Visit Date: 03/16/2017  Today's Provider: Wilhemena Durie, MD   Chief Complaint  Patient presents with  . Annual Exam   Subjective:    Annual physical exam Terry Liu is a 62 y.o. male who presents today for health maintenance and complete physical. He feels well. He reports exercising not regularly. He reports he is sleeping well.  Colonoscopy- 03/18/2008. Normal. Repeat in 10 yrs.  Tdap- 12/06/2011.    Review of Systems  Constitutional: Negative.   HENT: Negative.   Eyes: Negative.   Respiratory: Negative.   Cardiovascular: Negative.   Gastrointestinal: Negative.   Endocrine: Negative.   Genitourinary: Negative.   Musculoskeletal: Negative.   Skin: Negative.   Allergic/Immunologic: Negative.   Neurological: Negative.   Hematological: Negative.   Psychiatric/Behavioral: Negative.     Social History      He  reports that  has never smoked. he has never used smokeless tobacco. He reports that he does not drink alcohol or use drugs.       Social History   Socioeconomic History  . Marital status: Married    Spouse name: None  . Number of children: None  . Years of education: None  . Highest education level: None  Social Needs  . Financial resource strain: None  . Food insecurity - worry: None  . Food insecurity - inability: None  . Transportation needs - medical: None  . Transportation needs - non-medical: None  Occupational History  . None  Tobacco Use  . Smoking status: Never Smoker  . Smokeless tobacco: Never Used  Substance and Sexual Activity  . Alcohol use: No  . Drug use: No  . Sexual activity: None  Other Topics Concern  . None  Social History Narrative  . None    Past Medical History:  Diagnosis Date  . Arthritis    Osteoarthritis  . DVT of leg (deep venous thrombosis) ([place]) 2013   LLE/Dr [facility]da [facility]  . GERD (gastroesophageal reflux disease)   .  History of blood clots 2014  . Hyperlipidemia      Patient Active Problem List   Diagnosis Date Noted  . 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 PAC (premature atrial contraction) 08/23/2016  . Elevated prolactin level ([place]) 09/25/2015  . Gynecomastia, male 12/31/2014  . Closed head injury 09/19/2014  . GERD (gastroesophageal reflux disease) 09/19/2014  . ED (erectile dysfunction) 09/19/2014  . Obesity 09/19/2014  . Umbilical hernia without obstruction and without gangrene 09/19/2014  . Hemorrhoids, internal 09/19/2014  . 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 Diverticulitis large intestine w/o perforation or abscess w/o bleeding 09/19/2014  . Palpitations 09/19/2014  . Thrombophlebitis leg 09/19/2014  . Hematoma 09/19/2014  . Osteoarthritis 09/19/2014  . Hyperlipidemia 09/19/2014  . Encounter for follow-up of deep vein thrombosis (DVT) of left lower extremity 09/19/2014  . Sleep apnea 09/19/2014  . Snoring 09/19/2014  . Dermatitis, eczematoid 09/19/2014    Past Surgical History:  Procedure Laterality Date  . COLONOSCOPY  2010  . TONSILLECTOMY      Family History        Family Status  Relation Name Status  . Mother  Deceased at age 45       breast and lung cancer  . Father  Deceased at age 105       coronary Artery disease, Nicotine Dependence, Mi's x4 with first being at age 59.  [name]  Brother  Alive  . Annamarie Major  Alive       Myocardial Infarction at an early age  . Sister 2 Alive       Possible Melanoma, Gastric bypass  . Sister 1 Alive        His family history includes Breast cancer (age of onset: 87) in his mother; Diabetes in his sister; Lung cancer in his father and mother; Melanoma in his sister.      No Known Allergies   Current Outpatient Medications:  .  ELIQUIS 5 MG TABS tablet, TAKE 1 TABLET BY MOUTH TWICE DAILY, Disp: 60 tablet, Rfl: 12 .  metoprolol succinate (TOPROL-XL) 25 MG 24 hr tablet, TAKE 1 TABLET(25 MG) BY MOUTH DAILY, Disp: 30 tablet, Rfl: 3   Patient Care  Team: [name], MD as PCP - General (Family Medicine) [name], MD (Family Medicine) Bary Castilla, Forest Gleason, MD (General Surgery) Minna Merritts, MD as Consulting Physician (Cardiology)      Objective:   Vitals: BP 118/72 (BP Location: Right Arm, Patient Position: Sitting, Cuff Size: Large)   Pulse 64   Temp 97.9 F (36.6 C)   Resp 16   Ht 6' (1.829 m)   Wt 291 lb (132 kg)   BMI 39.47 kg/m    Vitals:   03/16/17 1010  BP: 118/72  Pulse: 64  Resp: 16  Temp: 97.9 F (36.6 C)  Weight: 291 lb (132 kg)  Height: 6' (1.829 m)     Physical Exam  Constitutional: He is oriented to person, place, and time. He appears well-developed and well-nourished.  Obese WM NAD.  HENT:  Head: Normocephalic and atraumatic.  Nose: Nose normal.  Mouth/Throat: Oropharynx is clear and moist.  Bilateral cauliflower ears.  Eyes: Conjunctivae are normal. No scleral icterus.  Neck: No thyromegaly present.  Cardiovascular: Normal rate, regular rhythm and normal heart sounds.  Pulmonary/Chest: Effort normal and breath sounds normal.  Abdominal: Soft.  Small umbilical hernia.  Genitourinary: Rectum normal, prostate normal and penis normal.  Musculoskeletal: Normal range of motion.  Neurological: He is alert and oriented to person, place, and time.  Skin: Skin is warm and dry.  Psychiatric: He has a normal mood and affect. His behavior is normal. Judgment and thought content normal.     Depression Screen PHQ 2/9 Scores 03/16/2017 12/31/2015 12/23/2014  PHQ - 2 Score 0 0 0      Assessment & Plan:     Routine Health Maintenance and Physical Exam  Exercise Activities and Dietary recommendations Goals    None      Immunization History  Administered Date(s) Administered  . Influenza,inj,Quad PF,6+ Mos 11/07/2014, 11/07/2015, 01/02/2017  . Rabies, IM 11/18/2016, 11/21/2016, 11/25/2016, 12/02/2016  . Tdap 12/06/2011    Health Maintenance  Topic Date Due  .  HIV Screening  02/15/1969  . COLONOSCOPY  03/18/2018  . TETANUS/TDAP  12/05/2021  . INFLUENZA VACCINE  Completed  . Hepatitis C Screening  Completed     Discussed health benefits of physical activity, and encouraged him to engage in regular exercise appropriate for his age and condition.  Pituitary Adenoma Pt says it is too expensive to have f/u MRI/endocrinology.  Obtain labs for f/u --may need f/u MRI or referral to endocrine. H/o DVT        Wilhemena Durie, MD  [facility]

## 2017-03-24 LAB — INSULIN-LIKE GROWTH FACTOR: INSULIN LIKE GF 1: 157 ng/mL (ref 49–188)

## 2017-03-24 LAB — ESTRADIOL: Estradiol: 20.9 pg/mL (ref 7.6–42.6)

## 2017-03-24 LAB — PROLACTIN: Prolactin: 11.6 ng/mL (ref 4.0–15.2)

## 2017-03-24 LAB — FOLLICLE STIMULATING HORMONE: FSH: 24.4 m[IU]/mL — AB (ref 1.5–12.4)

## 2017-03-24 LAB — CORTISOL: CORTISOL: 6 ug/dL

## 2017-03-24 LAB — CORTISOL, FREE: CORTISOL, FREE DIALYSIS, LCMS: 0.235 ug/dL

## 2017-03-24 LAB — LUTEINIZING HORMONE: LH: 9.2 m[IU]/mL — AB (ref 1.7–8.6)

## 2017-03-30 ENCOUNTER — Telehealth: Payer: Self-pay | Admitting: Emergency Medicine

## 2017-03-30 NOTE — Telephone Encounter (Signed)
LMTCB

## 2017-03-30 NOTE — Telephone Encounter (Signed)
-----   Message from [name], MD sent at 03/30/2017 10:08 AM EST ----- Labs stable.

## 2017-03-31 NOTE — Telephone Encounter (Signed)
Advised  ED 

## 2017-03-31 NOTE — Telephone Encounter (Signed)
Pt returned call about lab results. Pt request call back. Pt stated [place] and if we call and can't reach him to try calling him again. Please advise. Thanks TNP

## 2017-07-23 ENCOUNTER — Other Ambulatory Visit: Payer: Self-pay | Admitting: Cardiovascular Disease

## 2017-07-27 ENCOUNTER — Ambulatory Visit (INDEPENDENT_AMBULATORY_CARE_PROVIDER_SITE_OTHER): Payer: BLUE CROSS/BLUE SHIELD | Admitting: Family Medicine

## 2017-07-27 ENCOUNTER — Encounter: Payer: Self-pay | Admitting: Family Medicine

## 2017-07-27 VITALS — BP 110/80 | HR 78 | Temp 97.7°F | Resp 16 | Wt 289.0 lb

## 2017-07-27 DIAGNOSIS — K429 Umbilical hernia without obstruction or gangrene: Secondary | ICD-10-CM

## 2017-07-27 DIAGNOSIS — G4730 Sleep apnea, unspecified: Secondary | ICD-10-CM | POA: Diagnosis not present

## 2017-07-27 DIAGNOSIS — G3184 Mild cognitive impairment, so stated: Secondary | ICD-10-CM | POA: Diagnosis not present

## 2017-07-27 DIAGNOSIS — I803 Phlebitis and thrombophlebitis of lower extremities, unspecified: Secondary | ICD-10-CM

## 2017-07-27 NOTE — Progress Notes (Signed)
Patient: Terry Liu Male    DOB: 03-21-1955   62 y.o.   MRN: [id number] Visit Date: 07/27/2017  Today's Provider: Wilhemena Durie, MD   Chief Complaint  Patient presents with  . Follow-up   Subjective:    HPI  Lipid/Cholesterol, Follow-up:   Last seen for this 4 months ago.  Management changes since that visit include no changes. . Last Lipid Panel:    Component Value Date/Time   CHOL 188 12/14/2016 0910   CHOL 199 12/31/2015 1053   TRIG 132 12/14/2016 0910   HDL 48 12/14/2016 0910   HDL 44 12/31/2015 1053   CHOLHDL 3.9 12/14/2016 0910   LDLCALC 115 (H) 12/14/2016 0910    Risk factors for vascular disease include hypercholesterolemia  He reports good compliance with treatment. He is not having side effects.  Current symptoms include none and have been stable. Weight trend: fluctuating a bit Prior visit with dietician: no Current diet: in general, a "healthy" diet  , well balanced Current exercise: walking  Wt Readings from Last 3 Encounters:  07/27/17 289 lb (131.1 kg)  03/16/17 291 lb (132 kg)  12/28/16 (!) 306 lb (138.8 kg)    ------------------------------------------------------------------- Follow up of DVT: Patient was last seen for this problem 4 months ago and no changes were made.  Follow up of frequent PVC's: Patient was last seen for this problem 4 months ago and no changes were made.  Follow up of Sleep Apnea: Patient was last seen for this problem 4 months ago and no changes were made. Patient sleeps with a CPAP. He reports good compliance.     No Known Allergies   Current Outpatient Medications:  .  ELIQUIS 5 MG TABS tablet, TAKE 1 TABLET BY MOUTH TWICE DAILY, Disp: 60 tablet, Rfl: 12 .  metoprolol succinate (TOPROL-XL) 25 MG 24 hr tablet, TAKE 1 TABLET(25 MG) BY MOUTH DAILY, Disp: 30 tablet, Rfl: 0  Review of Systems  Constitutional: Negative for appetite change, chills and fever.  HENT: Negative.   Eyes: Negative.     Respiratory: Negative for chest tightness, shortness of breath and wheezing.   Cardiovascular: Positive for leg swelling (in ankles). Negative for chest pain and palpitations.  Gastrointestinal: Negative for abdominal pain, nausea and vomiting.  Endocrine: Negative.   Musculoskeletal: Positive for arthralgias (in shoulders).  Allergic/Immunologic: Negative.   Neurological: Negative.   Psychiatric/Behavioral: Negative.        Forgetful    Social History   Tobacco Use  . Smoking status: Never Smoker  . Smokeless tobacco: Never Used  Substance Use Topics  . Alcohol use: No   Objective:   BP 110/80 (BP Location: Right Arm, Patient Position: Sitting, Cuff Size: Large)   Pulse 78   Temp 97.7 F (36.5 C) (Oral)   Resp 16   Wt 289 lb (131.1 kg)   SpO2 95% Comment: room air  BMI 39.20 kg/m  Vitals:   07/27/17 0932  BP: 110/80  Pulse: 78  Resp: 16  Temp: 97.7 F (36.5 C)  TempSrc: Oral  SpO2: 95%  Weight: 289 lb (131.1 kg)   MMSE - Mini Mental State Exam 07/27/2017  Orientation to time 5  Orientation to Place 5  Registration 3  Attention/ Calculation 5  Recall 3  Language- name 2 objects 2  Language- repeat 1  Language- follow 3 step command 3  Language- read & follow direction 1  Write a sentence 1  Copy design 1  Total score 30    Physical Exam  Constitutional: He is oriented to person, place, and time. He appears well-developed and well-nourished.  HENT:  Head: Normocephalic and atraumatic.  Eyes: Conjunctivae are normal. No scleral icterus.  Neck: No thyromegaly present.  Cardiovascular: Normal rate, regular rhythm and normal heart sounds.  Pulmonary/Chest: Effort normal and breath sounds normal.  Abdominal: Soft.  Musculoskeletal: He exhibits no edema.  Neurological: He is alert and oriented to person, place, and time.  Skin: Skin is warm and dry.  Psychiatric: He has a normal mood and affect. His behavior is normal. Judgment and thought content normal.         Assessment & Plan:     1. Mild cognitive impairment MMSE normal. F/u on next OV.  2. Umbilical hernia without obstruction and without gangrene 3.Obesity 4.DVT 5.OSA     I have done the exam and reviewed the above chart and it is accurate to the best of my knowledge. Development worker, community has been used in this note in any air is in the dictation or transcription are unintentional.  Wilhemena Durie, MD  [place]

## 2017-08-21 ENCOUNTER — Other Ambulatory Visit: Payer: Self-pay | Admitting: Cardiovascular Disease

## 2017-09-18 ENCOUNTER — Other Ambulatory Visit: Payer: Self-pay | Admitting: Cardiovascular Disease

## 2017-10-07 ENCOUNTER — Ambulatory Visit (INDEPENDENT_AMBULATORY_CARE_PROVIDER_SITE_OTHER): Payer: BLUE CROSS/BLUE SHIELD

## 2017-10-07 DIAGNOSIS — Z23 Encounter for immunization: Secondary | ICD-10-CM | POA: Diagnosis not present

## 2017-10-18 ENCOUNTER — Other Ambulatory Visit: Payer: Self-pay | Admitting: Cardiovascular Disease

## 2017-10-18 ENCOUNTER — Other Ambulatory Visit: Payer: Self-pay | Admitting: Family Medicine

## 2017-10-26 ENCOUNTER — Other Ambulatory Visit: Payer: Self-pay | Admitting: Cardiovascular Disease

## 2017-10-26 NOTE — Telephone Encounter (Signed)
Refill

## 2017-11-02 ENCOUNTER — Encounter: Payer: Self-pay | Admitting: Family Medicine

## 2017-11-17 ENCOUNTER — Other Ambulatory Visit: Payer: Self-pay | Admitting: Cardiovascular Disease

## 2017-12-17 ENCOUNTER — Other Ambulatory Visit: Payer: Self-pay | Admitting: Cardiovascular Disease

## 2017-12-26 ENCOUNTER — Other Ambulatory Visit: Payer: Self-pay | Admitting: Cardiovascular Disease

## 2018-01-06 IMAGING — US US EXTREM LOW VENOUS*L*
1 series · 13 of 24 positions shown · non-contrast
Comparison: Left lower extremity venous Doppler ultrasound -
10/02/2013

CLINICAL DATA: Follow-up lower extremity DVT. History of DVT.
Evaluate for acute or chronic DVT.



[Series 1: us extrem low venous*left* · 0.10mm/px · 13 of 41 slices shown]
[im 1/41]
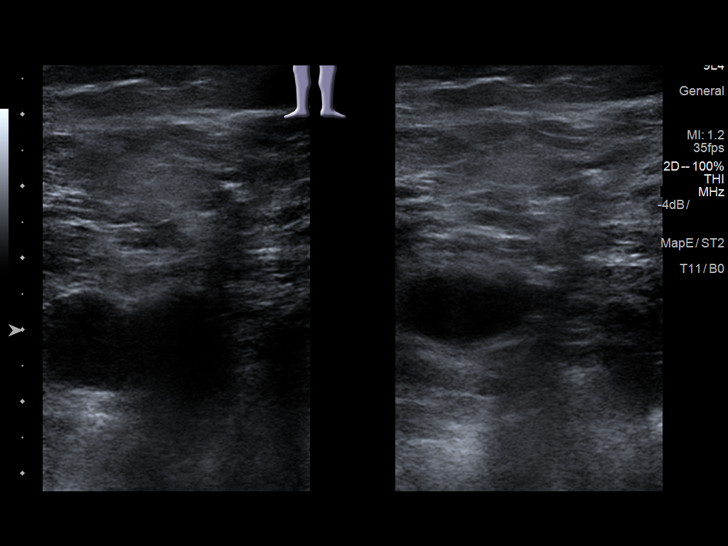
[im 4/41]
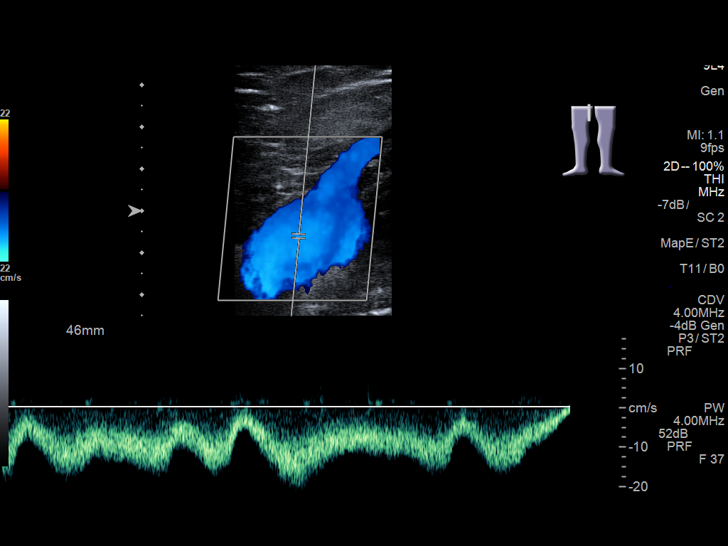
[im 7/41]
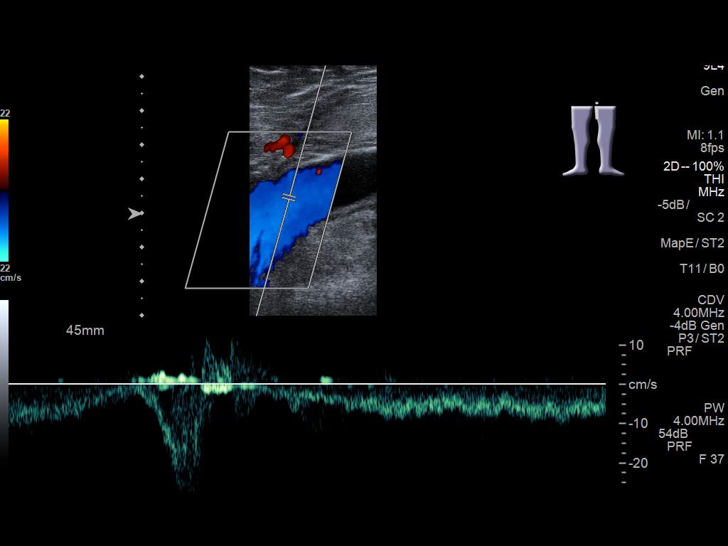
[im 11/41]
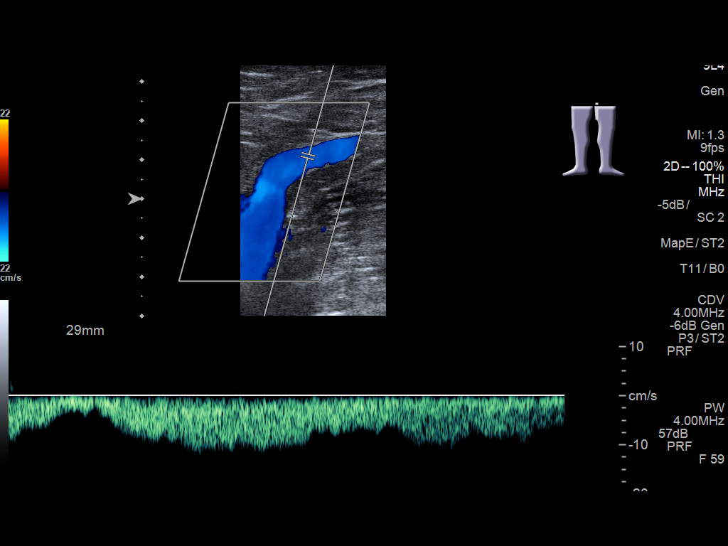
[im 14/41]
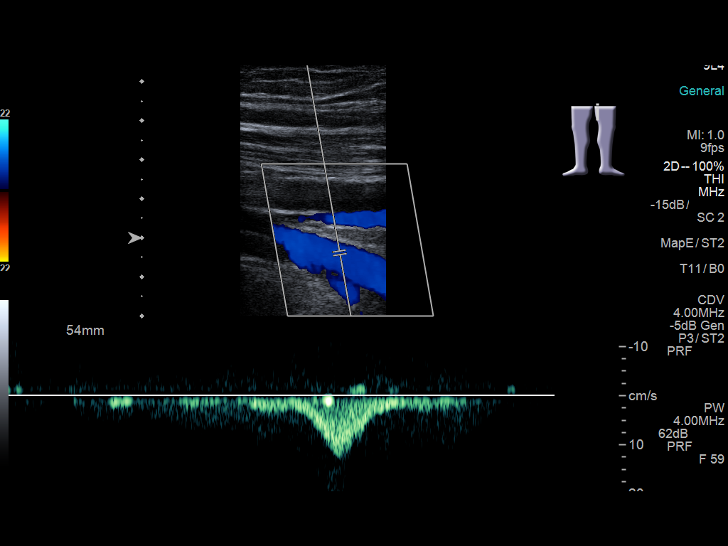
[im 18/41]
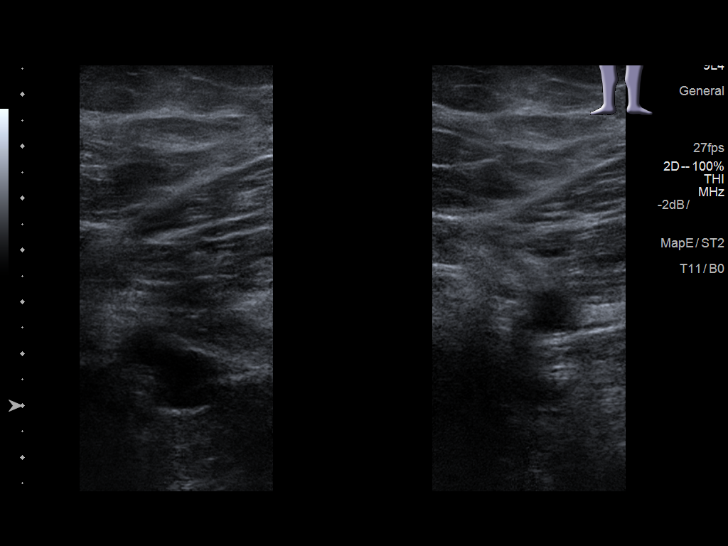
[im 21/41]
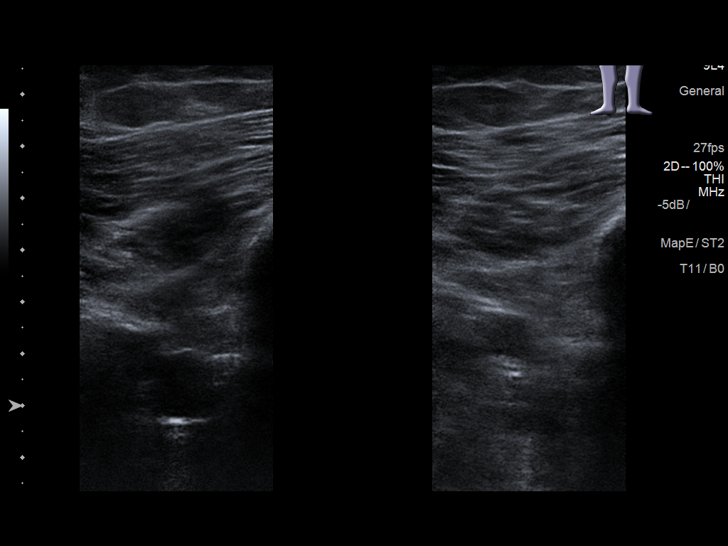
[im 23/41]
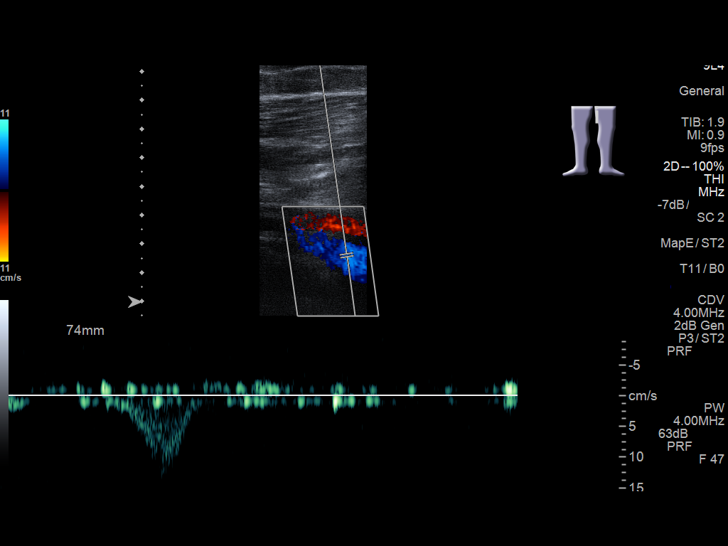
[im 27/41]
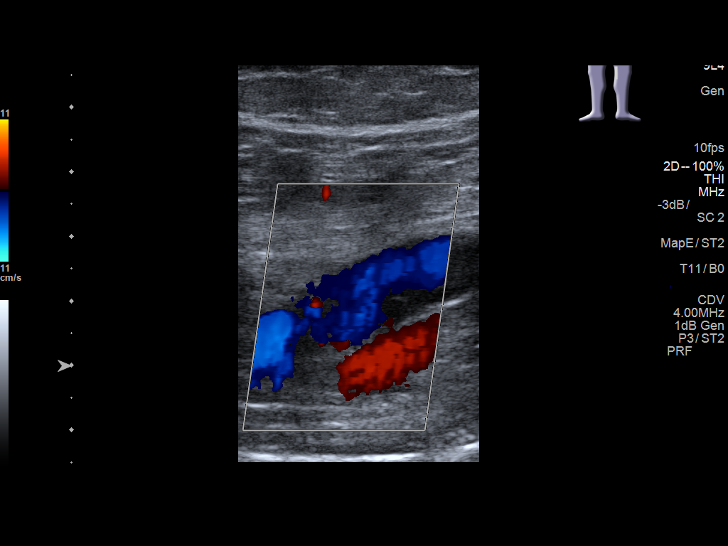
[im 30/41]
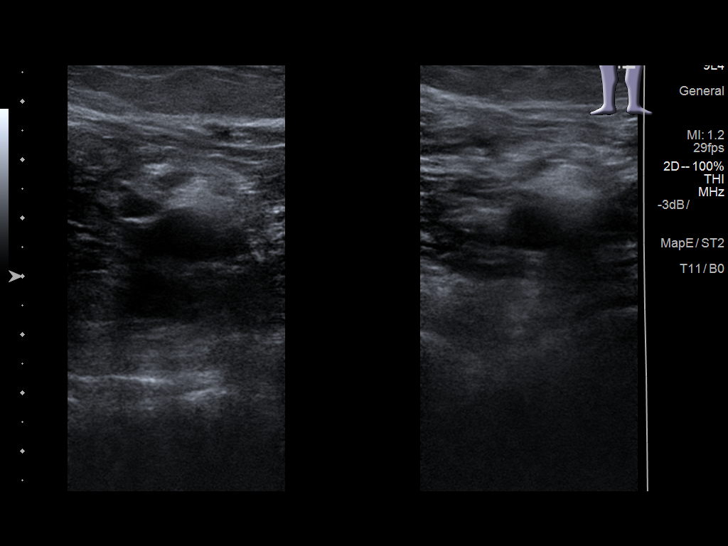
[im 34/41]
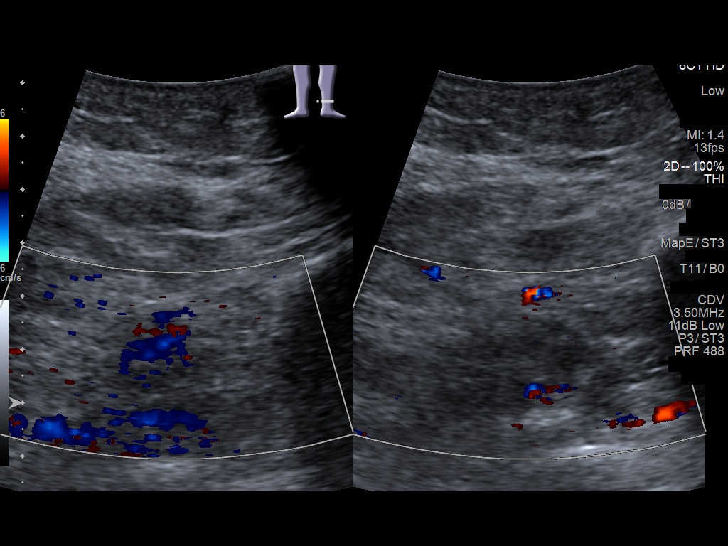
[im 37/41]
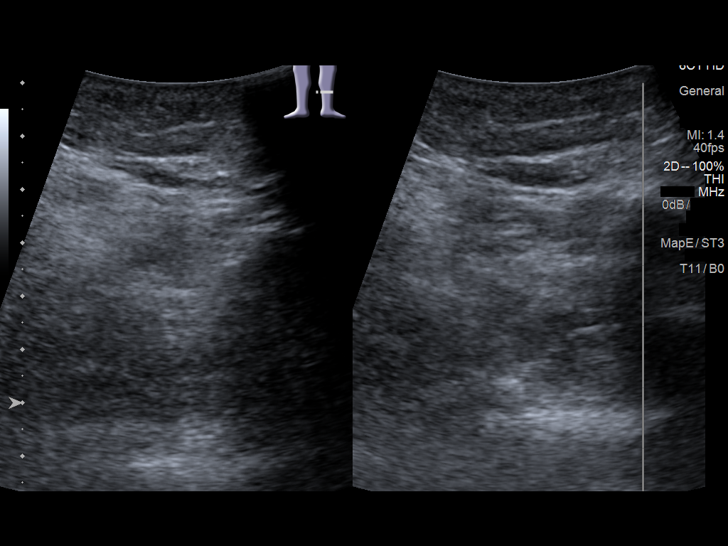
[im 41/41]
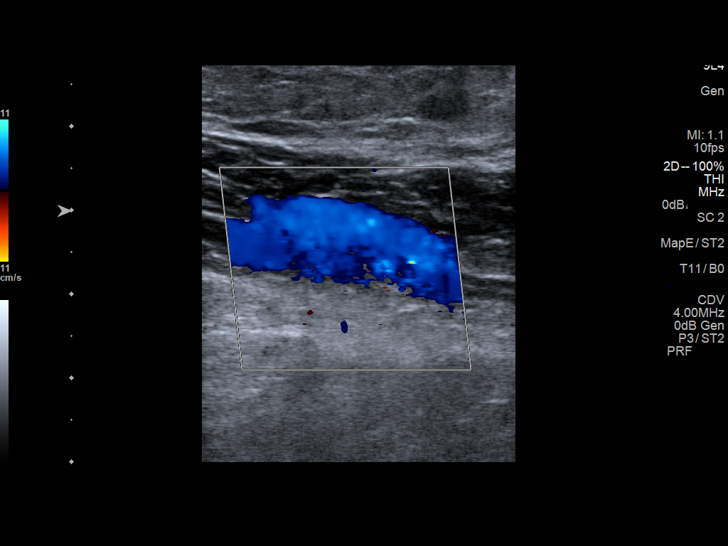

[13 of 24 positions shown; findings below may reference images not displayed]

FINDINGS: Contralateral Common Femoral Vein: Respiratory phasicity is normal
and symmetric with the symptomatic side. No evidence of thrombus.
Normal compressibility.

Common Femoral Vein: No evidence of thrombus. Normal
compressibility, respiratory phasicity and response to augmentation.

Saphenofemoral Junction: No evidence of thrombus. Normal
compressibility and flow on color Doppler imaging.

Profunda Femoral Vein: No evidence of thrombus. Normal
compressibility and flow on color Doppler imaging.

Femoral Vein: No evidence of thrombus. Normal compressibility,
respiratory phasicity and response to augmentation.

Popliteal Vein: There is a minimal amount of echogenic nonocclusive
wall thickening / DVT within the left popliteal vein (images 27 -
33), grossly unchanged compared to the [DATE] examination.

Calf Veins: No evidence of thrombus. Normal compressibility and flow
on color Doppler imaging.

Superficial Great Saphenous Vein: No evidence of thrombus. Normal
compressibility.

Venous Reflux:  None.

Other Findings:  None.
IMPRESSION: 1. No evidence of acute DVT within the left lower extremity.
2. Chronic nonocclusive wall thickening / DVT within the left
popliteal vein, grossly unchanged compared to the [DATE]
examination.

## 2018-01-29 ENCOUNTER — Ambulatory Visit: Payer: Self-pay | Admitting: Family Medicine

## 2018-03-07 ENCOUNTER — Telehealth: Payer: BLUE CROSS/BLUE SHIELD | Admitting: Nurse Practitioner

## 2018-03-07 DIAGNOSIS — J069 Acute upper respiratory infection, unspecified: Secondary | ICD-10-CM | POA: Diagnosis not present

## 2018-03-07 NOTE — Progress Notes (Signed)

## 2018-03-07 NOTE — Progress Notes (Signed)
5 minutes spent reviewing and documenting in chart

## 2018-03-08 ENCOUNTER — Encounter: Payer: Self-pay | Admitting: Family Medicine

## 2018-03-08 ENCOUNTER — Ambulatory Visit (INDEPENDENT_AMBULATORY_CARE_PROVIDER_SITE_OTHER): Payer: BLUE CROSS/BLUE SHIELD | Admitting: Family Medicine

## 2018-03-08 VITALS — BP 112/78 | HR 96 | Temp 100.0°F | Resp 20 | Ht 72.0 in | Wt 286.0 lb

## 2018-03-08 DIAGNOSIS — R6889 Other general symptoms and signs: Secondary | ICD-10-CM | POA: Diagnosis not present

## 2018-03-08 DIAGNOSIS — J101 Influenza due to other identified influenza virus with other respiratory manifestations: Secondary | ICD-10-CM

## 2018-03-08 LAB — POCT INFLUENZA A/B
INFLUENZA B, POC: NEGATIVE
Influenza A, POC: POSITIVE — AB

## 2018-03-08 MED ORDER — OSELTAMIVIR PHOSPHATE 75 MG PO CAPS: 75.0000 mg | ORAL_CAPSULE | Freq: Two times a day (BID) | ORAL | 0 refills | Status: DC

## 2018-03-08 NOTE — Progress Notes (Signed)
Patient: Terry Liu Male    DOB: [birth year]   63 y.o.   MRN: [id number] Visit Date: 03/08/2018  Today's Provider: Wilhemena Durie, MD   Chief Complaint  Patient presents with  . URI   Subjective:     URI   This is a new problem. The current episode started in the past 7 days (about 3 days). The problem has been unchanged. The maximum temperature recorded prior to his arrival was 100.4 - 100.9 F. Associated symptoms include congestion, coughing, headaches, rhinorrhea and sneezing. Pertinent negatives include no chest pain. He has tried acetaminophen, decongestant and antihistamine for the symptoms. The treatment provided no relief.   Patient reports that he tried doing a e-visit, and all they recommended was increased fluids and tylenol, and mucinex-d.   No Known Allergies   Current Outpatient Medications:  .  ELIQUIS 5 MG TABS tablet, TAKE 1 TABLET BY MOUTH TWICE DAILY, Disp: 60 tablet, Rfl: 11 .  metoprolol succinate (TOPROL-XL) 25 MG 24 hr tablet, TAKE 1 TABLET(25 MG) BY MOUTH DAILY (Patient not taking: Reported on 03/08/2018), Disp: 30 tablet, Rfl: 0  Review of Systems  Constitutional: Positive for activity change, chills, fatigue and fever. Negative for appetite change and diaphoresis.  HENT: Positive for congestion, postnasal drip, rhinorrhea, sinus pressure and sneezing.   Eyes: Negative.   Respiratory: Positive for cough and shortness of breath.   Cardiovascular: Negative for chest pain.  Endocrine: Negative.   Musculoskeletal: Positive for myalgias.  Allergic/Immunologic: Positive for environmental allergies.  Neurological: Positive for headaches.  Hematological: Negative.   Psychiatric/Behavioral: Negative.     Social History   Tobacco Use  . Smoking status: Never Smoker  . Smokeless tobacco: Never Used  Substance Use Topics  . Alcohol use: No      Objective:   BP 112/78 (BP Location: Left Arm, Patient Position: Sitting, Cuff Size: Normal)    Pulse 96   Temp 100 F (37.8 C)   Resp 20   Ht 6' (1.829 m)   Wt 286 lb (129.7 kg)   SpO2 98%   BMI 38.79 kg/m  Vitals:   03/08/18 1113  BP: 112/78  Pulse: 96  Resp: 20  Temp: 100 F (37.8 C)  SpO2: 98%  Weight: 286 lb (129.7 kg)  Height: 6' (1.829 m)     Physical Exam Constitutional:      Appearance: Normal appearance. He is well-developed.  HENT:     Head: Normocephalic and atraumatic.     Right Ear: Tympanic membrane and external ear normal.     Left Ear: Tympanic membrane and external ear normal.     Mouth/Throat:     Pharynx: Oropharynx is clear.  Eyes:     General: No scleral icterus.    Conjunctiva/sclera: Conjunctivae normal.  Neck:     Thyroid: No thyromegaly.  Cardiovascular:     Rate and Rhythm: Normal rate and regular rhythm.     Heart sounds: Normal heart sounds.  Pulmonary:     Effort: Pulmonary effort is normal.     Breath sounds: Normal breath sounds.  Abdominal:     Palpations: Abdomen is soft.  Skin:    General: Skin is warm and dry.  Neurological:     Mental Status: He is alert and oriented to person, place, and time.  Psychiatric:        Behavior: Behavior normal.        Thought Content: Thought content normal.  Judgment: Judgment normal.         Assessment & Plan    1. Flu-like symptoms  - POCT Influenza A/B - oseltamivir (TAMIFLU) 75 MG capsule; Take 1 capsule (75 mg total) by mouth 2 (two) times daily.  Dispense: 10 capsule; Refill: 0  2. Influenza A Treat with Tamiflu rest fluids and Tylenol.  Follow-up PRN.  Family is also treated.    I have done the exam and reviewed the above chart and it is accurate to the best of my knowledge. Development worker, community has been used in this note in any air is in the dictation or transcription are unintentional.  Wilhemena Durie, MD  [place]

## 2018-03-26 ENCOUNTER — Encounter: Payer: BLUE CROSS/BLUE SHIELD | Admitting: Family Medicine

## 2018-03-27 ENCOUNTER — Encounter: Payer: BLUE CROSS/BLUE SHIELD | Admitting: Family Medicine

## 2018-04-12 ENCOUNTER — Ambulatory Visit (INDEPENDENT_AMBULATORY_CARE_PROVIDER_SITE_OTHER): Payer: BLUE CROSS/BLUE SHIELD | Admitting: Family Medicine

## 2018-04-12 ENCOUNTER — Encounter: Payer: Self-pay | Admitting: Family Medicine

## 2018-04-12 VITALS — BP 140/92 | HR 70 | Temp 98.0°F | Ht 72.0 in | Wt 291.8 lb

## 2018-04-12 DIAGNOSIS — Z1211 Encounter for screening for malignant neoplasm of colon: Secondary | ICD-10-CM | POA: Diagnosis not present

## 2018-04-12 DIAGNOSIS — Z6839 Body mass index (BMI) 39.0-39.9, adult: Secondary | ICD-10-CM

## 2018-04-12 DIAGNOSIS — Z0000 Encounter for general adult medical examination without abnormal findings: Secondary | ICD-10-CM | POA: Diagnosis not present

## 2018-04-12 DIAGNOSIS — N529 Male erectile dysfunction, unspecified: Secondary | ICD-10-CM | POA: Diagnosis not present

## 2018-04-12 DIAGNOSIS — D352 Benign neoplasm of pituitary gland: Secondary | ICD-10-CM | POA: Diagnosis not present

## 2018-04-12 DIAGNOSIS — Z125 Encounter for screening for malignant neoplasm of prostate: Secondary | ICD-10-CM | POA: Diagnosis not present

## 2018-04-12 DIAGNOSIS — K429 Umbilical hernia without obstruction or gangrene: Secondary | ICD-10-CM | POA: Diagnosis not present

## 2018-04-12 MED ORDER — SILDENAFIL CITRATE 20 MG PO TABS: ORAL_TABLET | ORAL | 11 refills | Status: DC

## 2018-04-12 NOTE — Progress Notes (Signed)
Patient: Terry Liu, Male    DOB: 1955-04-14, 63 y.o.   MRN: [id number] Visit Date: 04/12/2018  Today's Provider: Wilhemena Durie, MD   Chief Complaint  Patient presents with  . Annual Exam   Subjective:     Annual physical exam Terry Liu is a 63 y.o. male who presents today for health maintenance and complete physical. He feels fairly well. He reports exercising rarely.[name] He reports he is sleeping fairly well.  Pt reports he was doing well.  Pt reports that in the last 8 months he has had issues going to the bathroom (constipation).  Pain in his lower abdomen.  -----------------------------------------------------------------   Review of Systems  Constitutional: Negative.   HENT: Positive for postnasal drip, sinus pressure and sneezing.   Eyes: Negative.   Respiratory: Positive for apnea.   Gastrointestinal: Positive for constipation.  Endocrine: Negative.   Genitourinary: Negative.   Musculoskeletal: Positive for back pain.  Allergic/Immunologic: Positive for environmental allergies.  Neurological: Negative.   Psychiatric/Behavioral: Negative.     Social History      He  reports that he has never smoked. He has never used smokeless tobacco. He reports that he does not drink alcohol or use drugs.       Social History   Socioeconomic History  . Marital status: Married    Spouse name: Not on file  . Number of children: Not on file  . Years of education: Not on file  . Highest education level: Not on file  Occupational History  . Not on file  Social Needs  . Financial resource strain: Not on file  . Food insecurity:    Worry: Not on file    Inability: Not on file  . Transportation needs:    Medical: Not on file    Non-medical: Not on file  Tobacco Use  . Smoking status: Never Smoker  . Smokeless tobacco: Never Used  Substance and Sexual Activity  . Alcohol use: No  . Drug use: No  . Sexual activity: Not on file  Lifestyle  . Physical  activity:    Days per week: Not on file    Minutes per session: Not on file  . Stress: Not on file  Relationships  . Social connections:    Talks on phone: Not on file    Gets together: Not on file    Attends religious service: Not on file    Active member of club or organization: Not on file    Attends meetings of clubs or organizations: Not on file    Relationship status: Not on file  Other Topics Concern  . Not on file  Social History Narrative  . Not on file    Past Medical History:  Diagnosis Date  . Arthritis    Osteoarthritis  . DVT of leg (deep venous thrombosis) ([place]) 2013   LLE/Dr Cynda Acres  . GERD (gastroesophageal reflux disease)   . History of blood clots 2014  . Hyperlipidemia      Patient Active Problem List   Diagnosis Date Noted  . 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 PAC (premature atrial contraction) 08/23/2016  . Elevated prolactin level ([place]) 09/25/2015  . Gynecomastia, male 12/31/2014  . Closed head injury 09/19/2014  . GERD (gastroesophageal reflux disease) 09/19/2014  . ED (erectile dysfunction) 09/19/2014  . Obesity 09/19/2014  . Umbilical hernia without obstruction and without gangrene 09/19/2014  . Hemorrhoids, internal 09/19/2014  . 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  Diverticulitis large intestine w/o perforation or abscess w/o bleeding 09/19/2014  . Palpitations 09/19/2014  . Thrombophlebitis leg 09/19/2014  . Hematoma 09/19/2014  . Osteoarthritis 09/19/2014  . Hyperlipidemia 09/19/2014  . Encounter for follow-up of deep vein thrombosis (DVT) of left lower extremity 09/19/2014  . Sleep apnea 09/19/2014  . Snoring 09/19/2014  . Dermatitis, eczematoid 09/19/2014    Past Surgical History:  Procedure Laterality Date  . COLONOSCOPY  2010  . TONSILLECTOMY      Family History        Family Status  Relation Name Status  . Mother  Deceased at age 55       breast and lung cancer  . Father  Deceased at age 25       coronary Artery disease, Nicotine  Dependence, Mi's x4 with first being at age 44.  [name] Brother  Alive  . Annamarie Major  Alive       Myocardial Infarction at an early age  . Sister 2 Alive       Possible Melanoma, Gastric bypass  . Sister 1 Alive        His family history includes Breast cancer (age of onset: 90) in his mother; Diabetes in his sister; Lung cancer in his father and mother; Melanoma in his sister.      No Known Allergies   Current Outpatient Medications:  .  ELIQUIS 5 MG TABS tablet, TAKE 1 TABLET BY MOUTH TWICE DAILY, Disp: 60 tablet, Rfl: 11 .  metoprolol succinate (TOPROL-XL) 25 MG 24 hr tablet, TAKE 1 TABLET(25 MG) BY MOUTH DAILY (Patient not taking: Reported on 03/08/2018), Disp: 30 tablet, Rfl: 0 .  oseltamivir (TAMIFLU) 75 MG capsule, Take 1 capsule (75 mg total) by mouth 2 (two) times daily., Disp: 10 capsule, Rfl: 0 .  sildenafil (REVATIO) 20 MG tablet, Take 1-5 tablets every day, Disp: 50 tablet, Rfl: 11   Patient Care Team: [name], MD as PCP - General (Family Medicine) [name], MD (Family Medicine) Bary Castilla, Forest Gleason, MD (General Surgery) Minna Merritts, MD as Consulting Physician (Cardiology)    Objective:    Vitals: BP (!) 140/92 (BP Location: Left Arm, Patient Position: Sitting, Cuff Size: Normal)   Pulse 70   Temp 98 F (36.7 C) (Oral)   Ht 6' (1.829 m)   Wt 291 lb 12.8 oz (132.4 kg)   SpO2 97%   BMI 39.58 kg/m    Vitals:   04/12/18 1455  BP: (!) 140/92  Pulse: 70  Temp: 98 F (36.7 C)  TempSrc: Oral  SpO2: 97%  Weight: 291 lb 12.8 oz (132.4 kg)  Height: 6' (1.829 m)     Physical Exam Constitutional:      Appearance: He is well-developed. He is obese.     Comments: Obese WM NAD.  HENT:     Head: Normocephalic and atraumatic.     Nose: Nose normal.  Eyes:     General: No scleral icterus.    Conjunctiva/sclera: Conjunctivae normal.  Neck:     Thyroid: No thyromegaly.  Cardiovascular:     Rate and Rhythm: Normal rate and regular  rhythm.     Heart sounds: Normal heart sounds.  Pulmonary:     Effort: Pulmonary effort is normal.     Breath sounds: Normal breath sounds.  Abdominal:     Palpations: Abdomen is soft.     Comments: Small umbilical hernia.  Genitourinary:    Penis: Normal.  Scrotum/Testes: Normal.  Musculoskeletal: Normal range of motion.  Skin:    General: Skin is warm and dry.  Neurological:     Mental Status: He is alert and oriented to person, place, and time.  Psychiatric:        Behavior: Behavior normal.        Thought Content: Thought content normal.        Judgment: Judgment normal.      Depression Screen PHQ 2/9 Scores 04/12/2018 03/16/2017 12/31/2015 12/23/2014  PHQ - 2 Score 0 0 0 0  PHQ- 9 Score 0 - - -       Assessment & Plan:     Routine Health Maintenance and Physical Exam  Exercise Activities and Dietary recommendations Goals   None     Immunization History  Administered Date(s) Administered  . Influenza,inj,Quad PF,6+ Mos 11/07/2014, 11/07/2015, 01/02/2017, 10/07/2017  . Rabies, IM 11/18/2016, 11/21/2016, 11/25/2016, 12/02/2016  . Tdap 12/06/2011    Health Maintenance  Topic Date Due  . HIV Screening  02/15/1969  . COLONOSCOPY  03/18/2018  . TETANUS/TDAP  12/05/2021  . INFLUENZA VACCINE  Completed  . Hepatitis C Screening  Completed     Discussed health benefits of physical activity, and encouraged him to engage in regular exercise appropriate for his age and condition.  1. Annual physical exam -Changes stressed with diet and exercise. - CBC with Differential/Platelet - Comprehensive metabolic panel - Lipid panel - TSH  2. Erectile dysfunction, unspecified erectile dysfunction type  - sildenafil (REVATIO) 20 MG tablet; Take 1-5 tablets every day  Dispense: 50 tablet; Refill: 11  3. Pituitary adenoma [facility]) Patient needs endocrine follow-up and follow-up MRI for pituitary adenoma.  He states he will not do it just because he simply cannot  afford to do so.  He states he is still paying off the last visits.  Obtain lab work today.  If levels of change motion will need to follow through on referral and MRI - Follicle Stimulating Hormone - Luteinizing hormone - Estradiol - Insulin-like growth factor - Cortisol - Cortisol, free, Serum - Prolactin  4. Prostate cancer screening  - PSA  5. Colon cancer screening Time for follow-up colonoscopy, last was March 08, 2008 - Ambulatory referral to Gastroenterology  6. Umbilical hernia without obstruction and without gangrene Will refer to surgery at patient request.  He declines now.  7. Class 2 severe obesity due to excess calories with serious comorbidity and body mass index (BMI) of 39.0 to 39.9 in adult ([place]) 8.  Unprovoked DVT x2      --------------------------------------------------------------------   I have done the exam and reviewed the above chart and it is accurate to the best of my knowledge. Development worker, community has been used in this note in any air is in the dictation or transcription are unintentional.  Wilhemena Durie, MD  [place]

## 2018-04-17 ENCOUNTER — Telehealth: Payer: Self-pay | Admitting: Family Medicine

## 2018-04-17 NOTE — Telephone Encounter (Signed)
Please review.  Pt requesting lab results

## 2018-04-17 NOTE — Telephone Encounter (Signed)
Pt calling for recent lab results.  Please advise.  Thanks, [employer]

## 2018-04-18 NOTE — Telephone Encounter (Signed)
Still waiting for results  CB#  [phone]  Thanks teri

## 2018-04-18 NOTE — Telephone Encounter (Signed)
Please review

## 2018-04-20 LAB — CBC WITH DIFFERENTIAL/PLATELET
Basophils Absolute: 0 10*3/uL (ref 0.0–0.2)
Basos: 1 %
EOS (ABSOLUTE): 0.3 10*3/uL (ref 0.0–0.4)
Eos: 5 %
Hematocrit: 42.1 % (ref 37.5–51.0)
Hemoglobin: 14.7 g/dL (ref 13.0–17.7)
Immature Grans (Abs): 0.1 10*3/uL (ref 0.0–0.1)
Immature Granulocytes: 1 %
Lymphocytes Absolute: 2 10*3/uL (ref 0.7–3.1)
Lymphs: 33 %
MCH: 30.6 pg (ref 26.6–33.0)
MCHC: 34.9 g/dL (ref 31.5–35.7)
MCV: 88 fL (ref 79–97)
MONOS ABS: 0.6 10*3/uL (ref 0.1–0.9)
Monocytes: 10 %
NEUTROS PCT: 50 %
Neutrophils Absolute: 3.1 10*3/uL (ref 1.4–7.0)
PLATELETS: 250 10*3/uL (ref 150–450)
RBC: 4.8 x10E6/uL (ref 4.14–5.80)
RDW: 12.2 % (ref 11.6–15.4)
WBC: 6.1 10*3/uL (ref 3.4–10.8)

## 2018-04-20 LAB — LIPID PANEL
CHOLESTEROL TOTAL: 217 mg/dL — AB (ref 100–199)
Chol/HDL Ratio: 5 ratio (ref 0.0–5.0)
HDL: 43 mg/dL (ref >39)
LDL Calculated: 132 mg/dL — ABNORMAL HIGH (ref 0–99)
Triglycerides: 211 mg/dL — ABNORMAL HIGH (ref 0–149)
VLDL CHOLESTEROL CAL: 42 mg/dL — AB (ref 5–40)

## 2018-04-20 LAB — COMPREHENSIVE METABOLIC PANEL
ALT: 24 IU/L (ref 0–44)
AST: 12 IU/L (ref 0–40)
Albumin/Globulin Ratio: 1.9 (ref 1.2–2.2)
Albumin: 4.4 g/dL (ref 3.8–4.8)
Alkaline Phosphatase: 83 IU/L (ref 39–117)
BUN/Creatinine Ratio: 11 (ref 10–24)
BUN: 13 mg/dL (ref 8–27)
Bilirubin Total: 0.6 mg/dL (ref 0.0–1.2)
CO2: 22 mmol/L (ref 20–29)
Calcium: 9.5 mg/dL (ref 8.6–10.2)
Chloride: 100 mmol/L (ref 96–106)
Creatinine, Ser: 1.17 mg/dL (ref 0.76–1.27)
GFR calc Af Amer: 76 mL/min/{1.73_m2} (ref >59)
GFR calc non Af Amer: 65 mL/min/{1.73_m2} (ref >59)
Globulin, Total: 2.3 g/dL (ref 1.5–4.5)
Glucose: 88 mg/dL (ref 65–99)
Potassium: 4.2 mmol/L (ref 3.5–5.2)
Sodium: 139 mmol/L (ref 134–144)
Total Protein: 6.7 g/dL (ref 6.0–8.5)

## 2018-04-20 LAB — ESTRADIOL: Estradiol: 32.2 pg/mL (ref 7.6–42.6)

## 2018-04-20 LAB — TSH: TSH: 3.27 u[IU]/mL (ref 0.450–4.500)

## 2018-04-20 LAB — PSA: Prostate Specific Ag, Serum: 0.9 ng/mL (ref 0.0–4.0)

## 2018-04-20 LAB — CORTISOL, FREE: Cortisol, Free Dialysis, LCMS: 0.134 ug/dL

## 2018-04-20 LAB — PROLACTIN: PROLACTIN: 11.9 ng/mL (ref 4.0–15.2)

## 2018-04-20 LAB — LUTEINIZING HORMONE: LH: 10.7 m[IU]/mL — AB (ref 1.7–8.6)

## 2018-04-20 LAB — CORTISOL: Cortisol: 4.8 ug/dL

## 2018-04-20 LAB — FOLLICLE STIMULATING HORMONE: FSH: 30.8 m[IU]/mL — AB (ref 1.5–12.4)

## 2018-04-20 LAB — INSULIN-LIKE GROWTH FACTOR: Insulin-Like GF-1: 135 ng/mL (ref 49–188)

## 2018-09-12 ENCOUNTER — Ambulatory Visit: Payer: Self-pay | Admitting: Family Medicine

## 2018-09-14 ENCOUNTER — Telehealth: Payer: Self-pay | Admitting: Family Medicine

## 2018-09-14 DIAGNOSIS — Z20828 Contact with and (suspected) exposure to other viral communicable diseases: Secondary | ICD-10-CM

## 2018-09-14 DIAGNOSIS — Z20822 Contact with and (suspected) exposure to covid-19: Secondary | ICD-10-CM

## 2018-09-14 NOTE — Telephone Encounter (Signed)
Caregiver in the home has tested positive for Covid in the last 4 days.  Needing to know if he needs to be tested or advise what he needs to do.  Please call [name] back at [phone]  Thanks, [facility]

## 2018-09-14 NOTE — Telephone Encounter (Signed)
Per Dr. Rosanna Randy, test due to exposure. Wife advised.

## 2018-09-15 ENCOUNTER — Other Ambulatory Visit: Payer: Self-pay

## 2018-09-15 DIAGNOSIS — Z20822 Contact with and (suspected) exposure to covid-19: Secondary | ICD-10-CM

## 2018-09-16 LAB — NOVEL CORONAVIRUS, NAA: SARS-CoV-2, NAA: NOT DETECTED

## 2018-09-20 ENCOUNTER — Other Ambulatory Visit: Payer: Self-pay

## 2018-09-20 DIAGNOSIS — Z20822 Contact with and (suspected) exposure to covid-19: Secondary | ICD-10-CM

## 2018-09-21 LAB — NOVEL CORONAVIRUS, NAA: SARS-CoV-2, NAA: NOT DETECTED

## 2018-10-16 ENCOUNTER — Ambulatory Visit (INDEPENDENT_AMBULATORY_CARE_PROVIDER_SITE_OTHER): Payer: BLUE CROSS/BLUE SHIELD

## 2018-10-16 DIAGNOSIS — Z23 Encounter for immunization: Secondary | ICD-10-CM | POA: Diagnosis not present

## 2018-12-10 ENCOUNTER — Other Ambulatory Visit: Payer: Self-pay | Admitting: Family Medicine

## 2018-12-14 ENCOUNTER — Telehealth: Payer: Self-pay | Admitting: *Deleted

## 2018-12-14 DIAGNOSIS — Z1211 Encounter for screening for malignant neoplasm of colon: Secondary | ICD-10-CM

## 2018-12-14 NOTE — Telephone Encounter (Signed)
yes

## 2018-12-14 NOTE — Telephone Encounter (Signed)
Patient called office requesting we reschedule his colonoscopy. Patient did not get colonoscopy done this year because GI office we referred him to did not accept his insurance [employer].  Is this ok to reordered? Please advise?

## 2018-12-17 NOTE — Telephone Encounter (Signed)
A new referral was placed with notes about location not being in network with [place].

## 2018-12-18 NOTE — Progress Notes (Deleted)
   {  Method of visit:23308}  Patient: Terry Liu Male    DOB: January 05, 1956   63 y.o.   MRN: [id number] Visit Date: 12/18/2018  Today's Provider: Wilhemena Durie, MD   No chief complaint on file.  Subjective:     Abdominal Pain Pertinent negatives include no fever, nausea or vomiting.    No Known Allergies   Current Outpatient Medications:  .  ELIQUIS 5 MG TABS tablet, TAKE 1 TABLET BY MOUTH TWICE DAILY, Disp: 60 tablet, Rfl: 5 .  metoprolol succinate (TOPROL-XL) 25 MG 24 hr tablet, TAKE 1 TABLET(25 MG) BY MOUTH DAILY (Patient not taking: Reported on 03/08/2018), Disp: 30 tablet, Rfl: 0 .  oseltamivir (TAMIFLU) 75 MG capsule, Take 1 capsule (75 mg total) by mouth 2 (two) times daily., Disp: 10 capsule, Rfl: 0 .  sildenafil (REVATIO) 20 MG tablet, Take 1-5 tablets every day, Disp: 50 tablet, Rfl: 11  Review of Systems  Constitutional: Negative for appetite change, chills and fever.  Respiratory: Negative for chest tightness, shortness of breath and wheezing.   Cardiovascular: Negative for chest pain and palpitations.  Gastrointestinal: Negative for abdominal pain, nausea and vomiting.    Social History   Tobacco Use  . Smoking status: Never Smoker  . Smokeless tobacco: Never Used  Substance Use Topics  . Alcohol use: No      Objective:   There were no vitals taken for this visit. There were no vitals filed for this visit.There is no height or weight on file to calculate BMI.   Physical Exam   No results found for any visits on 12/19/18.     Assessment & Plan        Wilhemena Durie, MD  [facility]

## 2018-12-19 ENCOUNTER — Ambulatory Visit: Payer: BLUE CROSS/BLUE SHIELD | Admitting: Family Medicine

## 2018-12-19 ENCOUNTER — Other Ambulatory Visit: Payer: Self-pay

## 2018-12-19 DIAGNOSIS — Z20822 Contact with and (suspected) exposure to covid-19: Secondary | ICD-10-CM

## 2018-12-20 LAB — NOVEL CORONAVIRUS, NAA: SARS-CoV-2, NAA: NOT DETECTED

## 2019-01-04 ENCOUNTER — Telehealth: Payer: Self-pay

## 2019-01-04 NOTE — Telephone Encounter (Signed)
Please advise 

## 2019-01-04 NOTE — Telephone Encounter (Signed)
  Copied from [place] [id number]. Topic: General - Other >> Jan 04, 2019  9:16 AM Parke Poisson wrote: Reason for CRM: Pt was sent to [facility] for colonoscopy. They state they will need pt to be off of ELIQUIS 5 MG before it can be done.They have sent a form for you to fill out

## 2019-01-18 ENCOUNTER — Other Ambulatory Visit: Payer: BLUE CROSS/BLUE SHIELD

## 2019-01-18 ENCOUNTER — Ambulatory Visit: Payer: BLUE CROSS/BLUE SHIELD | Attending: Internal Medicine

## 2019-01-18 DIAGNOSIS — Z20822 Contact with and (suspected) exposure to covid-19: Secondary | ICD-10-CM

## 2019-01-20 LAB — NOVEL CORONAVIRUS, NAA: SARS-CoV-2, NAA: NOT DETECTED

## 2019-01-30 ENCOUNTER — Telehealth: Payer: Self-pay

## 2019-01-30 NOTE — Telephone Encounter (Signed)
Copied from [place] [id number]. Topic: General - Other >> Jan 30, 2019  4:32 PM Sheran Luz wrote: Patient states that a diagnosis for sleep apnea needs to be sent to company [facility]) that provides CPAP machines to submit to claims.   Patient was unsure of information for [place], only that is it in [place] and their phone number.  # [phone]

## 2019-01-31 NOTE — Telephone Encounter (Signed)
Called pt back for more info. Information was faxed.

## 2019-02-18 ENCOUNTER — Ambulatory Visit: Payer: Self-pay | Admitting: Family Medicine

## 2019-02-20 ENCOUNTER — Telehealth: Payer: Self-pay

## 2019-02-20 NOTE — Telephone Encounter (Signed)
Copied from [place] [id number]. Topic: General - Inquiry >> Feb 20, 2019 12:35 PM Alease Frame wrote: Reason for [id number] from [place] called about a referral she received for patient. She received an order for a cpap settings also sleep study was done in 2018 is this a replacement  she is wanting a call back . She will need an office note on prior studies .  Call back [phone]

## 2019-02-20 NOTE — Telephone Encounter (Signed)
Returned call to [employer]. Patient will need an office visit, with notes stating patient is doing well on CPAP therapy. Also, will need to know want patient's current CPAP setting are. Patient has an appointment scheduled.

## 2019-02-25 NOTE — Telephone Encounter (Signed)
[facility] called again to get more info on the referral that was sent over . Please advise

## 2019-02-28 NOTE — Telephone Encounter (Signed)
Returned call to [employer].

## 2019-03-08 NOTE — Progress Notes (Addendum)
Patient: Terry Liu Male    DOB: 04-02-1955   64 y.o.   MRN: [id number] Visit Date: 03/08/2019  Today's Provider: Wilhemena Durie, MD   No chief complaint on file.  Subjective:     HPI  Overall patient feels fairly well.  He has several issues to discuss today.  It is time for his follow-up colonoscopy which was done in February 2010.  Of note is recently has had some constipation with 2-3 bowel movements per week.  He has had some pelvic discomfort with this.  He has not been evaluated for this at all.  No blood in his urine or stool or any weight loss. He has been using CPAP nightly since at least 2020.  He is doing well with this.  Compliant every night. He complains of progressive ED.  Has no chest pain or shortness of breath or any cardiac symptoms at all.    No Known Allergies   Current Outpatient Medications:  .  ELIQUIS 5 MG TABS tablet, TAKE 1 TABLET BY MOUTH TWICE DAILY, Disp: 60 tablet, Rfl: 5 .  metoprolol succinate (TOPROL-XL) 25 MG 24 hr tablet, TAKE 1 TABLET(25 MG) BY MOUTH DAILY (Patient not taking: Reported on 03/08/2018), Disp: 30 tablet, Rfl: 0 .  oseltamivir (TAMIFLU) 75 MG capsule, Take 1 capsule (75 mg total) by mouth 2 (two) times daily., Disp: 10 capsule, Rfl: 0 .  sildenafil (REVATIO) 20 MG tablet, Take 1-5 tablets every day, Disp: 50 tablet, Rfl: 11  Review of Systems  Constitutional: Negative for appetite change, chills and fever.  HENT: Negative.   Eyes: Negative.   Respiratory: Negative for chest tightness, shortness of breath and wheezing.   Cardiovascular: Negative for chest pain and palpitations.  Gastrointestinal: Positive for constipation. Negative for abdominal pain, nausea and vomiting.  Endocrine: Negative.   Genitourinary:       ED  Allergic/Immunologic: Negative.   Neurological: Negative.   Psychiatric/Behavioral: Negative.     Social History   Tobacco Use  . Smoking status: Never Smoker  . Smokeless tobacco: Never  Used  Substance Use Topics  . Alcohol use: No      Objective:   There were no vitals taken for this visit. There were no vitals filed for this visit.There is no height or weight on file to calculate BMI.   Physical Exam Vitals reviewed.  Constitutional:      Appearance: He is well-developed. He is obese.     Comments: Obese WM NAD.  HENT:     Head: Normocephalic and atraumatic.     Nose: Nose normal.  Eyes:     General: No scleral icterus.    Conjunctiva/sclera: Conjunctivae normal.  Neck:     Thyroid: No thyromegaly.  Cardiovascular:     Rate and Rhythm: Normal rate and regular rhythm.     Heart sounds: Normal heart sounds.  Pulmonary:     Effort: Pulmonary effort is normal.     Breath sounds: Normal breath sounds.  Abdominal:     Palpations: Abdomen is soft.     Comments: Moderate size umbilical hernia.  Genitourinary:    Penis: Normal.      Testes: Normal.  Musculoskeletal:        General: Normal range of motion.  Skin:    General: Skin is warm and dry.  Neurological:     Mental Status: He is alert and oriented to person, place, and time.  Psychiatric:  Behavior: Behavior normal.        Thought Content: Thought content normal.        Judgment: Judgment normal.      No results found for any visits on 03/11/19.     Assessment & Plan    1. Erectile dysfunction, unspecified erectile dysfunction type Try tadafil 20 mg every three days prn. - tadalafil (CIALIS) 20 MG tablet; Take 0.5-1 tablets (10-20 mg total) by mouth every 3 (three) days as needed for erectile dysfunction.  Dispense: 5 tablet; Refill: 11  2. Constipation, unspecified constipation type Try Colace or Glycolax daily for constipation. Refer to GI.  3. Screening for colon cancer  - Ambulatory referral to Gastroenterology  4. Umbilical hernia without obstruction and without gangrene Will eventually need surgical referral.  Class today.  5. Sleep apnea, unspecified type On nightly  CPAP. Patient on auto titrating CPAP from 4 to 20 cm of water. 6. Class 2 severe obesity due to excess calories with serious comorbidity and body mass index (BMI) of 39.0 to 39.9 in adult [facility]) Discussed the need to lose weight with a BMI of 41.64.  Diet and exercise discussed.  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    Follow up in the spring or summer for Physical.     I,Fawne Hughley,acting as a scribe for Wilhemena Durie, MD.,have documented all relevant documentation on the behalf of Wilhemena Durie, MD,as directed by  Wilhemena Durie, MD while in the presence of Wilhemena Durie, MD.     Wilhemena Durie, MD  [facility]

## 2019-03-11 ENCOUNTER — Ambulatory Visit (INDEPENDENT_AMBULATORY_CARE_PROVIDER_SITE_OTHER): Payer: Medicare Other | Admitting: Family Medicine

## 2019-03-11 ENCOUNTER — Other Ambulatory Visit: Payer: Self-pay

## 2019-03-11 ENCOUNTER — Encounter: Payer: Self-pay | Admitting: Family Medicine

## 2019-03-11 VITALS — BP 122/85 | HR 80 | Temp 97.7°F | Resp 18 | Ht 72.0 in | Wt 307.0 lb

## 2019-03-11 DIAGNOSIS — K5900 Constipation, unspecified: Secondary | ICD-10-CM | POA: Diagnosis not present

## 2019-03-11 DIAGNOSIS — Z1211 Encounter for screening for malignant neoplasm of colon: Secondary | ICD-10-CM

## 2019-03-11 DIAGNOSIS — G4730 Sleep apnea, unspecified: Secondary | ICD-10-CM | POA: Diagnosis not present

## 2019-03-11 DIAGNOSIS — I825Z9 Chronic embolism and thrombosis of unspecified deep veins of unspecified distal lower extremity: Secondary | ICD-10-CM

## 2019-03-11 DIAGNOSIS — K429 Umbilical hernia without obstruction or gangrene: Secondary | ICD-10-CM | POA: Diagnosis not present

## 2019-03-11 DIAGNOSIS — Z6839 Body mass index (BMI) 39.0-39.9, adult: Secondary | ICD-10-CM

## 2019-03-11 DIAGNOSIS — N529 Male erectile dysfunction, unspecified: Secondary | ICD-10-CM

## 2019-03-11 DIAGNOSIS — D352 Benign neoplasm of pituitary gland: Secondary | ICD-10-CM

## 2019-03-11 MED ORDER — TADALAFIL 20 MG PO TABS: 10.0000 mg | ORAL_TABLET | ORAL | 11 refills | Status: DC | PRN

## 2019-03-11 NOTE — Patient Instructions (Addendum)
Try Colace or Glycolax daily for constipation. Follow up in the spring or summer for Physical.

## 2019-03-12 ENCOUNTER — Other Ambulatory Visit: Payer: Self-pay

## 2019-03-12 ENCOUNTER — Telehealth: Payer: Self-pay

## 2019-03-12 DIAGNOSIS — Z1211 Encounter for screening for malignant neoplasm of colon: Secondary | ICD-10-CM

## 2019-03-12 NOTE — Telephone Encounter (Signed)
Gastroenterology Pre-Procedure Review  Request Date: 04/09/19 Requesting Physician: Dr. Allen Norris  PATIENT REVIEW QUESTIONS: The patient responded to the following health history questions as indicated:    1. Are you having any GI issues? yes (Constipation) 2. Do you have a personal history of Polyps? no 3. Do you have a family history of Colon Cancer or Polyps? no 4. Diabetes Mellitus? no 5. Joint replacements in the past 12 months?no 6. Major health problems in the past 3 months?no 7. Any artificial heart valves, MVP, or defibrillator?no    MEDICATIONS & ALLERGIES:    Patient reports the following regarding taking any anticoagulation/antiplatelet therapy:   Plavix, Coumadin, Eliquis, Xarelto, Lovenox, Pradaxa, Brilinta, or Effient? yes (Eliquis blood thinner sent to Dr. Rosanna Randy) Aspirin? no  Patient confirms/reports the following medications:  Current Outpatient Medications  Medication Sig Dispense Refill  . ELIQUIS 5 MG TABS tablet TAKE 1 TABLET BY MOUTH TWICE DAILY 60 tablet 5  . metoprolol succinate (TOPROL-XL) 25 MG 24 hr tablet TAKE 1 TABLET(25 MG) BY MOUTH DAILY (Patient not taking: Reported on 03/08/2018) 30 tablet 0  . oseltamivir (TAMIFLU) 75 MG capsule Take 1 capsule (75 mg total) by mouth 2 (two) times daily. 10 capsule 0  . sildenafil (REVATIO) 20 MG tablet Take 1-5 tablets every day 50 tablet 11  . tadalafil (CIALIS) 20 MG tablet Take 0.5-1 tablets (10-20 mg total) by mouth every 3 (three) days as needed for erectile dysfunction. 5 tablet 11   No current facility-administered medications for this visit.    Patient confirms/reports the following allergies:  No Known Allergies  No orders of the defined types were placed in this encounter.   AUTHORIZATION INFORMATION Primary Insurance: 1D#: Group #:  Secondary Insurance: 1D#: Group #:  SCHEDULE INFORMATION: Date: Tuesday 04/09/19 Time: Location:[facility]

## 2019-03-13 ENCOUNTER — Telehealth: Payer: Self-pay

## 2019-03-13 NOTE — Telephone Encounter (Signed)
Blood Thinner Advice received from Dr. Rosanna Randy. Pt has been advised per Dr. Marlan Palau Blood Thinner Request received on 03/13/19 to stop Eliquis 2 days prior to his colonoscopy, and to restart 2 days after his procedure.  Patient verbalized understanding and has been asked to call the office back if he has any questions.  Thanks, [place], [place]

## 2019-04-04 ENCOUNTER — Ambulatory Visit: Payer: Medicare Other

## 2019-04-05 ENCOUNTER — Other Ambulatory Visit
Admission: RE | Admit: 2019-04-05 | Discharge: 2019-04-05 | Disposition: A | Discharge status: Home / Self Care | Payer: Medicare Other | Source: Ambulatory Visit | Attending: Gastroenterology | Admitting: Gastroenterology

## 2019-04-05 DIAGNOSIS — Z01812 Encounter for preprocedural laboratory examination: Secondary | ICD-10-CM | POA: Diagnosis not present

## 2019-04-05 DIAGNOSIS — Z20822 Contact with and (suspected) exposure to covid-19: Secondary | ICD-10-CM | POA: Diagnosis not present

## 2019-04-06 LAB — SARS CORONAVIRUS 2 (TAT 6-24 HRS): SARS Coronavirus 2: NEGATIVE

## 2019-04-09 ENCOUNTER — Encounter
Admission: RE | Disposition: A | Discharge status: Home / Self Care | Payer: Self-pay | Source: Home / Self Care | Attending: Gastroenterology

## 2019-04-09 ENCOUNTER — Ambulatory Visit: Payer: Medicare Other | Admitting: Anesthesiology

## 2019-04-09 ENCOUNTER — Encounter: Payer: Self-pay | Admitting: Gastroenterology

## 2019-04-09 ENCOUNTER — Ambulatory Visit
Admission: RE | Admit: 2019-04-09 | Discharge: 2019-04-09 | Disposition: A | Discharge status: Home / Self Care | Payer: Medicare Other | Attending: Gastroenterology | Admitting: Gastroenterology

## 2019-04-09 ENCOUNTER — Other Ambulatory Visit: Payer: Self-pay

## 2019-04-09 DIAGNOSIS — Z86718 Personal history of other venous thrombosis and embolism: Secondary | ICD-10-CM | POA: Insufficient documentation

## 2019-04-09 DIAGNOSIS — Z7901 Long term (current) use of anticoagulants: Secondary | ICD-10-CM | POA: Diagnosis not present

## 2019-04-09 DIAGNOSIS — Z1211 Encounter for screening for malignant neoplasm of colon: Secondary | ICD-10-CM | POA: Diagnosis not present

## 2019-04-09 DIAGNOSIS — K633 Ulcer of intestine: Secondary | ICD-10-CM | POA: Diagnosis not present

## 2019-04-09 DIAGNOSIS — G4730 Sleep apnea, unspecified: Secondary | ICD-10-CM | POA: Diagnosis not present

## 2019-04-09 DIAGNOSIS — Z79899 Other long term (current) drug therapy: Secondary | ICD-10-CM | POA: Insufficient documentation

## 2019-04-09 DIAGNOSIS — K5730 Diverticulosis of large intestine without perforation or abscess without bleeding: Secondary | ICD-10-CM | POA: Diagnosis not present

## 2019-04-09 DIAGNOSIS — I2510 Atherosclerotic heart disease of native coronary artery without angina pectoris: Secondary | ICD-10-CM | POA: Diagnosis not present

## 2019-04-09 DIAGNOSIS — K635 Polyp of colon: Secondary | ICD-10-CM

## 2019-04-09 DIAGNOSIS — I10 Essential (primary) hypertension: Secondary | ICD-10-CM | POA: Diagnosis not present

## 2019-04-09 DIAGNOSIS — K5790 Diverticulosis of intestine, part unspecified, without perforation or abscess without bleeding: Secondary | ICD-10-CM | POA: Diagnosis not present

## 2019-04-09 HISTORY — PX: COLONOSCOPY WITH PROPOFOL: SHX5780

## 2019-04-09 HISTORY — DX: Sleep apnea, unspecified: G47.30

## 2019-04-09 SURGERY — COLONOSCOPY WITH PROPOFOL
Anesthesia: General

## 2019-04-09 MED ORDER — FENTANYL CITRATE (PF) 100 MCG/2ML IJ SOLN
INTRAMUSCULAR | Status: AC
  Filled 2019-04-09: qty 2

## 2019-04-09 MED ORDER — PROPOFOL 500 MG/50ML IV EMUL
INTRAVENOUS | Status: AC
  Filled 2019-04-09: qty 50

## 2019-04-09 MED ORDER — PROPOFOL 500 MG/50ML IV EMUL
INTRAVENOUS | Status: DC | PRN
  Administered 2019-04-09: 120 ug/kg/min via INTRAVENOUS

## 2019-04-09 MED ORDER — FENTANYL CITRATE (PF) 100 MCG/2ML IJ SOLN
INTRAMUSCULAR | Status: DC | PRN
  Administered 2019-04-09: 50 ug via INTRAVENOUS

## 2019-04-09 MED ORDER — MIDAZOLAM HCL 2 MG/2ML IJ SOLN
INTRAMUSCULAR | Status: AC
  Filled 2019-04-09: qty 2

## 2019-04-09 MED ORDER — SODIUM CHLORIDE 0.9 % IV SOLN
INTRAVENOUS | Status: DC
  Administered 2019-04-09: 1000 mL via INTRAVENOUS

## 2019-04-09 MED ORDER — ACETAMINOPHEN 500 MG PO TABS
ORAL_TABLET | ORAL | Status: AC
  Administered 2019-04-09: 1000 mg via ORAL
  Filled 2019-04-09: qty 2

## 2019-04-09 MED ORDER — LIDOCAINE HCL (PF) 2 % IJ SOLN
INTRAMUSCULAR | Status: AC
  Filled 2019-04-09: qty 10

## 2019-04-09 MED ORDER — LIDOCAINE HCL (CARDIAC) PF 100 MG/5ML IV SOSY
PREFILLED_SYRINGE | INTRAVENOUS | Status: DC | PRN
  Administered 2019-04-09: 50 mg via INTRAVENOUS

## 2019-04-09 MED ORDER — ACETAMINOPHEN 500 MG PO TABS: 1000.0000 mg | ORAL_TABLET | Freq: Four times a day (QID) | ORAL | Status: DC | PRN

## 2019-04-09 MED ORDER — MIDAZOLAM HCL 2 MG/2ML IJ SOLN
INTRAMUSCULAR | Status: DC | PRN
  Administered 2019-04-09: 2 mg via INTRAVENOUS

## 2019-04-09 NOTE — Transfer of Care (Signed)
Immediate Anesthesia Transfer of Care Note  Patient: Terry Liu  Procedure(s) Performed: COLONOSCOPY WITH PROPOFOL (N/A )  Patient Location: PACU  Anesthesia Type:General  Level of Consciousness: awake and alert   Airway & Oxygen Therapy: Patient Spontanous Breathing  Post-op Assessment: Report given to RN and Post -op Vital signs reviewed and stable  Post vital signs: Reviewed and stable  Last Vitals:  Vitals Value Taken Time  BP 118/80 04/09/19 0830  Temp 37.1 C 04/09/19 0830  Pulse 91 04/09/19 0831  Resp 18 04/09/19 0831  SpO2 97 % 04/09/19 0831  Vitals shown include unvalidated device data.  Last Pain:  Vitals:   04/09/19 0830  TempSrc: Tympanic  PainSc:          Complications: No apparent anesthesia complications

## 2019-04-09 NOTE — H&P (Signed)
Lucilla Lame, MD [address] [place], [place] [address] Phone: [phone] Fax : [phone]  Primary Care Physician:  [name], MD Primary Gastroenterologist:  Dr. Allen Norris  Pre-Procedure History & Physical: HPI:  Terry Liu is a 64 y.o. male is here for a screening colonoscopy.   Past Medical History:  Diagnosis Date  . Arthritis    Osteoarthritis  . DVT of leg (deep venous thrombosis) ([place]) 2013   LLE/Dr Cynda Acres  . GERD (gastroesophageal reflux disease)   . History of blood clots 2014  . Hyperlipidemia   . Sleep apnea     Past Surgical History:  Procedure Laterality Date  . COLONOSCOPY  2010  . TONSILLECTOMY      Prior to Admission medications   Medication Sig Start Date End Date Taking? Authorizing Provider  ELIQUIS 5 MG TABS tablet TAKE 1 TABLET BY MOUTH TWICE DAILY 12/10/18   [name], MD  metoprolol succinate (TOPROL-XL) 25 MG 24 hr tablet TAKE 1 TABLET(25 MG) BY MOUTH DAILY Patient not taking: Reported on 03/08/2018 11/17/17   Minna Merritts, MD  oseltamivir (TAMIFLU) 75 MG capsule Take 1 capsule (75 mg total) by mouth 2 (two) times daily. 03/08/18   [name], MD  sildenafil (REVATIO) 20 MG tablet Take 1-5 tablets every day 04/12/18   [name], MD  tadalafil (CIALIS) 20 MG tablet Take 0.5-1 tablets (10-20 mg total) by mouth every 3 (three) days as needed for erectile dysfunction. 03/11/19   [name], MD    Allergies as of 03/12/2019  . (No Known Allergies)    Family History  Problem Relation Age of Onset  . Lung cancer Mother   . Breast cancer Mother 87  . Lung cancer Father   . Diabetes Sister   . Melanoma Sister        benign breast tumor    Social History   Socioeconomic History  . Marital status: Married    Spouse name: Not on file  . Number of children: Not on file  . Years of education: Not on file  . Highest education level: Not on file  Occupational History   . Not on file  Tobacco Use  . Smoking status: Never Smoker  . Smokeless tobacco: Never Used  Substance and Sexual Activity  . Alcohol use: No  . Drug use: No  . Sexual activity: Not on file  Other Topics Concern  . Not on file  Social History Narrative  . Not on file   Social Determinants of Health   Financial Resource Strain:   . Difficulty of Paying Living Expenses: Not on file  Food Insecurity:   . Worried About Charity fundraiser in the Last Year: Not on file  . Ran Out of Food in the Last Year: Not on file  Transportation Needs:   . Lack of Transportation (Medical): Not on file  . Lack of Transportation (Non-Medical): Not on file  Physical Activity:   . Days of Exercise per Week: Not on file  . Minutes of Exercise per Session: Not on file  Stress:   . Feeling of Stress : Not on file  Social Connections:   . Frequency of Communication with Friends and Family: Not on file  . Frequency of Social Gatherings with Friends and Family: Not on file  . Attends Religious Services: Not on file  . Active Member of Clubs or Organizations: Not on file  .  Attends Archivist Meetings: Not on file  . Marital Status: Not on file  Intimate Partner Violence:   . Fear of Current or Ex-Partner: Not on file  . Emotionally Abused: Not on file  . Physically Abused: Not on file  . Sexually Abused: Not on file    Review of Systems: See HPI, otherwise negative ROS  Physical Exam: BP (!) 137/93   Pulse 77   Temp 98.7 F (37.1 C) (Tympanic)   Resp 20   Ht 6' (1.829 m)   Wt 133.8 kg   SpO2 98%   BMI 40.01 kg/m  General:   Alert,  pleasant and cooperative in NAD Head:  Normocephalic and atraumatic. Neck:  Supple; no masses or thyromegaly. Lungs:  Clear throughout to auscultation.    Heart:  Regular rate and rhythm. Abdomen:  Soft, nontender and nondistended. Normal bowel sounds, without guarding, and without rebound.   Neurologic:  Alert and  oriented x4;  grossly  normal neurologically.  Impression/Plan: Terry Liu is now here to undergo a screening colonoscopy.  Risks, benefits, and alternatives regarding colonoscopy have been reviewed with the patient.  Questions have been answered.  All parties agreeable.

## 2019-04-09 NOTE — Anesthesia Preprocedure Evaluation (Addendum)
Anesthesia Evaluation  Patient identified by MRN, date of birth, ID band Patient awake    Reviewed: Allergy & Precautions, NPO status , Patient's Chart, lab work & pertinent test results  History of Anesthesia Complications Negative for: history of anesthetic complications  Airway Mallampati: III  TM Distance: >3 FB Neck ROM: Full    Dental no notable dental hx. (+) Teeth Intact   Pulmonary sleep apnea and Continuous Positive Airway Pressure Ventilation , neg COPD, Patient abstained from smoking.Not current smoker,    Pulmonary exam normal breath sounds clear to auscultation       Cardiovascular Exercise Tolerance: Good METS(-) hypertension(-) CAD and (-) Past MI negative cardio ROS  (-) dysrhythmias  Rhythm:Regular Rate:Normal - Systolic murmurs    Neuro/Psych negative neurological ROS  negative psych ROS   GI/Hepatic GERD  Controlled,(+)     (-) substance abuse  ,   Endo/Other  neg diabetesMorbid obesity  Renal/GU negative Renal ROS     Musculoskeletal  (+) Arthritis , Osteoarthritis,    Abdominal   Peds  Hematology DVT on eliquis   Anesthesia Other Findings Past Medical History: No date: Arthritis     Comment:  Osteoarthritis 2013: DVT of leg (deep venous thrombosis) ([facility])      Comment:  LLE/Dr Cynda Acres No date: GERD (gastroesophageal reflux disease) 2014: History of blood clots No date: Hyperlipidemia No date: Sleep apnea  Reproductive/Obstetrics                            Anesthesia Physical Anesthesia Plan  ASA: III  Anesthesia Plan: General   Post-op Pain Management:    Induction: Intravenous  PONV Risk Score and Plan: 2 and Ondansetron, Propofol infusion and TIVA  Airway Management Planned: Nasal Cannula  Additional Equipment: None  Intra-op Plan:   Post-operative Plan:   Informed Consent: I have reviewed the patients History and Physical, chart, labs and  discussed the procedure including the risks, benefits and alternatives for the proposed anesthesia with the patient or authorized representative who has indicated his/her understanding and acceptance.     Dental advisory given  Plan Discussed with: CRNA and Surgeon  Anesthesia Plan Comments: (Discussed risks of anesthesia with patient, including possibility of difficulty with spontaneous ventilation under anesthesia necessitating airway intervention, PONV, and rare risks such as cardiac or respiratory or neurological events. Patient understands.)       Anesthesia Quick Evaluation

## 2019-04-09 NOTE — Anesthesia Postprocedure Evaluation (Signed)
Anesthesia Post Note  Patient: Terry Liu  Procedure(s) Performed: COLONOSCOPY WITH PROPOFOL (N/A )  Patient location during evaluation: Endoscopy Anesthesia Type: General Level of consciousness: awake and alert Pain management: pain level controlled Vital Signs Assessment: post-procedure vital signs reviewed and stable Respiratory status: spontaneous breathing, nonlabored ventilation, respiratory function stable and patient connected to nasal cannula oxygen Cardiovascular status: blood pressure returned to baseline and stable Postop Assessment: no apparent nausea or vomiting Anesthetic complications: no     Last Vitals:  Vitals:   04/09/19 0830 04/09/19 0840  BP: 118/80 112/69  Pulse: 89 86  Resp: 12 15  Temp: 37.1 C   SpO2: 95% 96%    Last Pain:  Vitals:   04/09/19 0830  TempSrc: Tympanic  PainSc:                  Arita Miss

## 2019-04-09 NOTE — OR Nursing (Signed)
PT c/o of headache. Pain level 2-3. Tylenol administered with pain level 1 on discharge.

## 2019-04-09 NOTE — Op Note (Signed)
[facility] Patient Name: Terry Liu Procedure Date: 04/09/2019 7:27 AM MRN: [id number] Account #: [id number] Date of Birth: [birth year] Admit Type: Outpatient Age: 64 Room: [facility] ENDO ROOM 4 Gender: Male Note Status: Finalized Procedure:             Colonoscopy Indications:           Screening for colorectal malignant neoplasm Providers:             Lucilla Lame MD, MD Medicines:             Propofol per Anesthesia Complications:         No immediate complications. Procedure:             Pre-Anesthesia Assessment:                        - Prior to the procedure, a History and Physical was                         performed, and patient medications and allergies were                         reviewed. The patient's tolerance of previous                         anesthesia was also reviewed. The risks and benefits                         of the procedure and the sedation options and risks                         were discussed with the patient. All questions were                         answered, and informed consent was obtained. Prior                         Anticoagulants: The patient has taken no previous                         anticoagulant or antiplatelet agents. ASA Grade                         Assessment: III - A patient with severe systemic                         disease. After reviewing the risks and benefits, the                         patient was deemed in satisfactory condition to                         undergo the procedure.                        After obtaining informed consent, the colonoscope was                         passed under direct vision. Throughout the procedure,  the patient's blood pressure, pulse, and oxygen                         saturations were monitored continuously. The                         Colonoscope was introduced through the anus and                         advanced to the the  cecum, identified by appendiceal                         orifice and ileocecal valve. The colonoscopy was                         performed without difficulty. The patient tolerated                         the procedure well. The quality of the bowel                         preparation was excellent. Findings:      The perianal and digital rectal examinations were normal.      A 6 mm polyp was found in the cecum. The polyp was sessile. The polyp       was removed with a cold snare. Resection and retrieval were complete.      A 3 mm polyp was found in the ascending colon. The polyp was sessile.       The polyp was removed with a cold snare. Resection was complete, but the       polyp tissue was not retrieved.      Multiple small-mouthed diverticula were found in the sigmoid colon. Impression:            - One 6 mm polyp in the cecum, removed with a cold                         snare. Resected and retrieved.                        - One 3 mm polyp in the ascending colon, removed with                         a cold snare. Complete resection. Polyp tissue not                         retrieved.                        - Diverticulosis in the sigmoid colon. Recommendation:        - Discharge patient to home.                        - Resume previous diet.                        - Continue present medications.                        - Await pathology results.                        -  Repeat colonoscopy in 5 years if polyp adenoma and                         10 years if hyperplastic Procedure Code(s):     --- Professional ---                        [phone], Colonoscopy, flexible; with removal of                         tumor(s), polyp(s), or other lesion(s) by snare                         technique Diagnosis Code(s):     --- Professional ---                        Z12.11, Encounter for screening for malignant neoplasm                         of colon                        K63.5, Polyp of  colon CPT copyright 2019 American Medical Association. All rights reserved. The codes documented in this report are preliminary and upon coder review may  be revised to meet current compliance requirements. Lucilla Lame MD, MD 04/09/2019 8:21:43 AM This report has been signed electronically. Number of Addenda: 0 Note Initiated On: 04/09/2019 7:27 AM Scope Withdrawal Time: 0 hours 11 minutes 23 seconds  Total Procedure Duration: 0 hours 14 minutes 58 seconds  Estimated Blood Loss:  Estimated blood loss: none.      [facility]

## 2019-04-10 ENCOUNTER — Encounter: Payer: Self-pay | Admitting: *Deleted

## 2019-04-10 LAB — SURGICAL PATHOLOGY

## 2019-04-11 ENCOUNTER — Encounter: Payer: Self-pay | Admitting: Gastroenterology

## 2019-05-29 ENCOUNTER — Ambulatory Visit (INDEPENDENT_AMBULATORY_CARE_PROVIDER_SITE_OTHER): Payer: Medicare Other | Admitting: Family Medicine

## 2019-05-29 ENCOUNTER — Encounter: Payer: Self-pay | Admitting: Family Medicine

## 2019-05-29 ENCOUNTER — Other Ambulatory Visit: Payer: Self-pay

## 2019-05-29 VITALS — BP 123/81 | HR 67 | Temp 97.5°F | Ht 72.0 in | Wt 304.4 lb

## 2019-05-29 DIAGNOSIS — Z0000 Encounter for general adult medical examination without abnormal findings: Secondary | ICD-10-CM

## 2019-05-29 DIAGNOSIS — Z86718 Personal history of other venous thrombosis and embolism: Secondary | ICD-10-CM

## 2019-05-29 DIAGNOSIS — M1990 Unspecified osteoarthritis, unspecified site: Secondary | ICD-10-CM | POA: Diagnosis not present

## 2019-05-29 DIAGNOSIS — Z09 Encounter for follow-up examination after completed treatment for conditions other than malignant neoplasm: Secondary | ICD-10-CM

## 2019-05-29 DIAGNOSIS — Z6839 Body mass index (BMI) 39.0-39.9, adult: Secondary | ICD-10-CM

## 2019-05-29 DIAGNOSIS — G4730 Sleep apnea, unspecified: Secondary | ICD-10-CM | POA: Diagnosis not present

## 2019-05-29 DIAGNOSIS — G629 Polyneuropathy, unspecified: Secondary | ICD-10-CM | POA: Diagnosis not present

## 2019-05-29 MED ORDER — GABAPENTIN 100 MG PO CAPS: 100.0000 mg | ORAL_CAPSULE | Freq: Two times a day (BID) | ORAL | 5 refills | Status: DC

## 2019-05-29 NOTE — Progress Notes (Signed)
Annual Wellness Visit     Patient: Terry Liu, Male    DOB: February 17, 1955, 64 y.o.   MRN: [id number] Visit Date: 05/29/2019  Today's Provider: Wilhemena Durie, MD   Chief Complaint  Patient presents with  . Annual Exam    Welcome to Medicare   Subjective    Terry Liu is a 64 y.o. male who presents today for his welcome to Medicare visit He reports consuming a general diet. 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 He generally feels fairly well. He reports sleeping poorly. He does have additional problems to discuss today.   HPI   Past Medical History:  Diagnosis Date  . Arthritis    Osteoarthritis  . DVT of leg (deep venous thrombosis) ([place]) 2013   LLE/Dr Cynda Acres  . GERD (gastroesophageal reflux disease)   . History of blood clots 2014  . Hyperlipidemia   . Sleep apnea        Medications: Outpatient Medications Prior to Visit  Medication Sig  . ELIQUIS 5 MG TABS tablet TAKE 1 TABLET BY MOUTH TWICE DAILY  . metoprolol succinate (TOPROL-XL) 25 MG 24 hr tablet TAKE 1 TABLET(25 MG) BY MOUTH DAILY (Patient not taking: Reported on 03/08/2018)  . oseltamivir (TAMIFLU) 75 MG capsule Take 1 capsule (75 mg total) by mouth 2 (two) times daily.  . sildenafil (REVATIO) 20 MG tablet Take 1-5 tablets every day  . tadalafil (CIALIS) 20 MG tablet Take 0.5-1 tablets (10-20 mg total) by mouth every 3 (three) days as needed for erectile dysfunction.   No facility-administered medications prior to visit.    No Known Allergies  Patient Care Team: [name], MD as PCP - General (Family Medicine) [name], MD (Family Medicine) Bary Castilla, Forest Gleason, MD (General Surgery) Minna Merritts, MD as Consulting Physician (Cardiology)  Review of Systems  Constitutional: Negative.   HENT: Positive for tinnitus.        Tenderness without dizziness  Eyes: Negative.   Respiratory: Negative.   Cardiovascular: Negative.   Gastrointestinal:  Negative.   Endocrine: Negative.   Genitourinary: Negative.   Musculoskeletal: Positive for back pain.       He is awakened at night with what starts his thoracic back pain and then goes down into his lower back.  No chest pain and no other associated symptoms.  Does not really bother him much during the day  Skin: Negative.   Allergic/Immunologic: Negative.   Neurological: Negative.   Hematological: Negative.   Psychiatric/Behavioral: Negative.        Objective    Vitals: BP 123/81 (BP Location: Left Arm, Patient Position: Sitting, Cuff Size: Large)   Pulse 67   Temp (!) 97.5 F (36.4 C) (Temporal)   Ht 6' (1.829 m)   Wt (!) 304 lb 6.4 oz (138.1 kg)   SpO2 97%   BMI 41.28 kg/m  BP Readings from Last 3 Encounters:  05/29/19 123/81  04/09/19 119/80  03/11/19 122/85   Wt Readings from Last 3 Encounters:  05/29/19 (!) 304 lb 6.4 oz (138.1 kg)  04/09/19 295 lb (133.8 kg)  03/11/19 (!) 307 lb (139.3 kg)      Physical Exam Vitals reviewed.  Constitutional:      Appearance: He is well-developed. He is obese.     Comments: Obese WM NAD.  HENT:     Head: Normocephalic and atraumatic.     Ears:     Comments: He has cauliflower  ears of the pinna.    Nose: Nose normal.  Eyes:     General: No scleral icterus.    Conjunctiva/sclera: Conjunctivae normal.  Neck:     Thyroid: No thyromegaly.  Cardiovascular:     Rate and Rhythm: Normal rate and regular rhythm.     Heart sounds: Normal heart sounds.  Pulmonary:     Effort: Pulmonary effort is normal.     Breath sounds: Normal breath sounds.  Abdominal:     Palpations: Abdomen is soft.     Comments: Small umbilical hernia.  Genitourinary:    Penis: Normal.      Testes: Normal.  Musculoskeletal:        General: Normal range of motion.  Skin:    General: Skin is warm and dry.  Neurological:     General: No focal deficit present.     Mental Status: He is alert and oriented to person, place, and time.  Psychiatric:          Mood and Affect: Mood normal.        Behavior: Behavior normal.        Thought Content: Thought content normal.        Judgment: Judgment normal.      Most recent functional status assessment: No flowsheet data found.  Most recent fall risk assessment: Fall Risk  04/12/2018  Falls in the past year? 0  Number falls in past yr: -  Injury with Fall? -     Most recent depression screenings: PHQ 2/9 Scores 04/12/2018 03/16/2017  PHQ - 2 Liu 0 0  PHQ- 9 Liu 0 -    Most recent cognitive screening: No flowsheet data found.  No results found for any visits on 05/29/19.  Assessment & Plan     Annual wellness visit done today including the all of the following: Reviewed patient's Family Medical History Reviewed and updated list of patient's medical providers Assessment of cognitive impairment was done Assessed patient's functional ability Established a written schedule for health screening [place] Completed and Reviewed  Exercise Activities and Dietary recommendations Goals   None     Immunization History  Administered Date(s) Administered  . Influenza,inj,Quad PF,6+ Mos 11/07/2014, 11/07/2015, 01/02/2017, 10/07/2017, 10/16/2018  . Rabies, IM 11/18/2016, 11/21/2016, 11/25/2016, 12/02/2016  . Tdap 12/06/2011    Health Maintenance  Topic Date Due  . HIV Screening  Never done  . COVID-19 Vaccine (1) Never done  . PNA vac Low Risk Adult (1 of 2 - PCV13) Never done  . INFLUENZA VACCINE  09/01/2019  . TETANUS/TDAP  12/05/2021  . COLONOSCOPY  04/08/2024  . Hepatitis C Screening  Completed     Discussed health benefits of physical activity, and encouraged him to engage in regular exercise appropriate for his age and condition.    1. Welcome to Medicare preventive visit  - EKG 12-Lead  2. Neuropathy Start gabapentin.  We will see the patient back in 1 month to reassess control and to recheck some labs. - gabapentin (NEURONTIN) 100 MG  capsule; Take 1 capsule (100 mg total) by mouth 2 (two) times daily.  Dispense: 60 capsule; Refill: 5  3. Encounter for follow-up of deep vein thrombosis (DVT) of left lower extremity Lifelong anticoagulant  4. Sleep apnea, unspecified type   5. Osteoarthritis, unspecified osteoarthritis type, unspecified site   6. Class 2 severe obesity due to excess calories with serious comorbidity and body mass index (BMI) of 39.0 to 39.9 in adult [facility]) Diet  and exercise with weight loss is stressed.  7.  Chronic tinnitus    No follow-ups on file.     I, Wilhemena Durie, MD, have reviewed all documentation for this visit. The documentation on 06/01/19 for the exam, diagnosis, procedures, and orders are all accurate and complete.    Jeevan Kalla Cranford Mon, MD  [facility] [phone] (phone) [phone] (fax)  [place]

## 2019-06-21 NOTE — Progress Notes (Signed)
Established patient visit   I,Atiya M Cummings,acting as a scribe for Wilhemena Durie, MD.,have documented all relevant documentation on the behalf of Wilhemena Durie, MD,as directed by  Wilhemena Durie, MD while in the presence of Wilhemena Durie, MD.  Patient: Terry Liu   DOB: [birth year]   64 y.o. Male  MRN: [id number] Visit Date: 06/27/2019  Today's healthcare provider: Wilhemena Durie, MD   Chief Complaint  Patient presents with  . Follow-up    Neuropathy   Subjective    HPI   Patient presents today for a 1 month follow up on Neuropathy. Patient says that he is not taking the gabapentin because he was not feeling too sure about taking it.   Neuropathy From 05/29/2019-Started gabapentin.  We will see the patient back in 1 month to reassess control and to recheck some labs.  Encounter for follow-up of deep vein thrombosis (DVT) of left lower extremity From 05/29/2019-Lifelong anticoagulant  Class 2 severe obesity due to excess calories with serious comorbidity and body mass index (BMI) of 39.0 to 39.9 in adult [facility]) From 05/29/2019-Diet and exercise with weight loss is stressed.       Medications: Outpatient Medications Prior to Visit  Medication Sig  . ELIQUIS 5 MG TABS tablet TAKE 1 TABLET BY MOUTH TWICE DAILY  . gabapentin (NEURONTIN) 100 MG capsule Take 1 capsule (100 mg total) by mouth 2 (two) times daily. (Patient not taking: Reported on 06/27/2019)  . metoprolol succinate (TOPROL-XL) 25 MG 24 hr tablet TAKE 1 TABLET(25 MG) BY MOUTH DAILY (Patient not taking: Reported on 03/08/2018)  . oseltamivir (TAMIFLU) 75 MG capsule Take 1 capsule (75 mg total) by mouth 2 (two) times daily.  . sildenafil (REVATIO) 20 MG tablet Take 1-5 tablets every day  . tadalafil (CIALIS) 20 MG tablet Take 0.5-1 tablets (10-20 mg total) by mouth every 3 (three) days as needed for erectile dysfunction.   No facility-administered medications prior to visit.    Review of  Systems  Constitutional: Negative for appetite change, chills and fever.  Respiratory: Negative for chest tightness, shortness of breath and wheezing.   Cardiovascular: Negative for chest pain and palpitations.  Gastrointestinal: Negative for abdominal pain, nausea and vomiting.  Endocrine: Negative.   Allergic/Immunologic: Negative.   Hematological: Negative.   Psychiatric/Behavioral: Negative.        Objective    BP 109/71 (BP Location: Left Arm, Patient Position: Sitting, Cuff Size: Large)   Pulse 79   Temp (!) 97.3 F (36.3 C) (Temporal)   Ht 6' (1.829 m)   Wt (!) 302 lb 3.2 oz (137.1 kg)   BMI 40.99 kg/m     Physical Exam Vitals reviewed.  Constitutional:      Appearance: Normal appearance.  HENT:     Head: Normocephalic and atraumatic.     Right Ear: External ear normal.     Left Ear: External ear normal.  Eyes:     General: No scleral icterus.    Conjunctiva/sclera: Conjunctivae normal.  Cardiovascular:     Rate and Rhythm: Normal rate and regular rhythm.     Pulses: Normal pulses.     Heart sounds: Normal heart sounds.  Pulmonary:     Effort: Pulmonary effort is normal.     Breath sounds: Normal breath sounds.  Musculoskeletal:     Right lower leg: No edema.     Left lower leg: No edema.  Skin:    General: Skin is warm and dry.  Neurological:     General: No focal deficit present.     Mental Status: He is alert and oriented to person, place, and time.  Psychiatric:        Mood and Affect: Mood normal.        Behavior: Behavior normal.        Thought Content: Thought content normal.        Judgment: Judgment normal.       No results found for any visits on 06/27/19.  Assessment & Plan     1. Neuropathy We will attempt to titrate up on gabapentin to try to help with pain./Discomfort. - gabapentin (NEURONTIN) 100 MG capsule; Take 2 - 3 capsules by mouth at night  Dispense: 60 capsule; Refill: 5 - TSH - Lipid panel - Comprehensive metabolic  panel - CBC with Differential/Platelet  2. Encounter for follow-up of deep vein thrombosis (DVT) of left lower extremity Lifelong I anticoagulant - TSH - Lipid panel - Comprehensive metabolic panel - CBC with Differential/Platelet  3. Class 2 severe obesity due to excess calories with serious comorbidity and body mass index (BMI) of 39.0 to 39.9 in adult [facility]) Lifestyle with diet and exercise discussed. - TSH - Lipid panel - Comprehensive metabolic panel - CBC with Differential/Platelet  4. Screening for prostate cancer  - PSA  5. Mixed hyperlipidemia  - TSH - Lipid panel - Comprehensive metabolic panel - CBC with Differential/Platelet  6. Frequent PVCs Clinically insignificant.  Work up if needed.,  If symptomatic. - TSH - Lipid panel - Comprehensive metabolic panel - CBC with Differential/Platelet   No follow-ups on file.      I, Wilhemena Durie, MD, have reviewed all documentation for this visit. The documentation on 06/30/19 for the exam, diagnosis, procedures, and orders are all accurate and complete.    Shaleen Talamantez Cranford Mon, MD  [facility] [phone] (phone) [phone] (fax)  [place]

## 2019-06-27 ENCOUNTER — Ambulatory Visit (INDEPENDENT_AMBULATORY_CARE_PROVIDER_SITE_OTHER): Payer: Medicare Other | Admitting: Family Medicine

## 2019-06-27 ENCOUNTER — Other Ambulatory Visit: Payer: Self-pay

## 2019-06-27 ENCOUNTER — Encounter: Payer: Self-pay | Admitting: Family Medicine

## 2019-06-27 VITALS — BP 109/71 | HR 79 | Temp 97.3°F | Ht 72.0 in | Wt 302.2 lb

## 2019-06-27 DIAGNOSIS — Z86718 Personal history of other venous thrombosis and embolism: Secondary | ICD-10-CM | POA: Diagnosis not present

## 2019-06-27 DIAGNOSIS — I493 Ventricular premature depolarization: Secondary | ICD-10-CM

## 2019-06-27 DIAGNOSIS — G629 Polyneuropathy, unspecified: Secondary | ICD-10-CM | POA: Diagnosis not present

## 2019-06-27 DIAGNOSIS — E782 Mixed hyperlipidemia: Secondary | ICD-10-CM

## 2019-06-27 DIAGNOSIS — Z125 Encounter for screening for malignant neoplasm of prostate: Secondary | ICD-10-CM

## 2019-06-27 DIAGNOSIS — Z6839 Body mass index (BMI) 39.0-39.9, adult: Secondary | ICD-10-CM

## 2019-06-27 DIAGNOSIS — Z09 Encounter for follow-up examination after completed treatment for conditions other than malignant neoplasm: Secondary | ICD-10-CM | POA: Diagnosis not present

## 2019-06-27 MED ORDER — GABAPENTIN 100 MG PO CAPS: ORAL_CAPSULE | ORAL | 5 refills | Status: DC

## 2019-06-28 LAB — CBC WITH DIFFERENTIAL/PLATELET
Basophils Absolute: 0.1 10*3/uL (ref 0.0–0.2)
Basos: 1 %
EOS (ABSOLUTE): 0.3 10*3/uL (ref 0.0–0.4)
Eos: 5 %
Hematocrit: 42.7 % (ref 37.5–51.0)
Hemoglobin: 14.6 g/dL (ref 13.0–17.7)
Immature Grans (Abs): 0 10*3/uL (ref 0.0–0.1)
Immature Granulocytes: 1 %
Lymphocytes Absolute: 1.9 10*3/uL (ref 0.7–3.1)
Lymphs: 35 %
MCH: 30.5 pg (ref 26.6–33.0)
MCHC: 34.2 g/dL (ref 31.5–35.7)
MCV: 89 fL (ref 79–97)
Monocytes Absolute: 0.6 10*3/uL (ref 0.1–0.9)
Monocytes: 10 %
Neutrophils Absolute: 2.8 10*3/uL (ref 1.4–7.0)
Neutrophils: 48 %
Platelets: 254 10*3/uL (ref 150–450)
RBC: 4.79 x10E6/uL (ref 4.14–5.80)
RDW: 12.2 % (ref 11.6–15.4)
WBC: 5.6 10*3/uL (ref 3.4–10.8)

## 2019-06-28 LAB — COMPREHENSIVE METABOLIC PANEL
ALT: 27 IU/L (ref 0–44)
AST: 15 IU/L (ref 0–40)
Albumin/Globulin Ratio: 2 (ref 1.2–2.2)
Albumin: 4.4 g/dL (ref 3.8–4.8)
Alkaline Phosphatase: 90 IU/L (ref 48–121)
BUN/Creatinine Ratio: 13 (ref 10–24)
BUN: 15 mg/dL (ref 8–27)
Bilirubin Total: 0.6 mg/dL (ref 0.0–1.2)
CO2: 21 mmol/L (ref 20–29)
Calcium: 9.5 mg/dL (ref 8.6–10.2)
Chloride: 103 mmol/L (ref 96–106)
Creatinine, Ser: 1.14 mg/dL (ref 0.76–1.27)
GFR calc Af Amer: 78 mL/min/{1.73_m2} (ref >59)
GFR calc non Af Amer: 67 mL/min/{1.73_m2} (ref >59)
Globulin, Total: 2.2 g/dL (ref 1.5–4.5)
Glucose: 92 mg/dL (ref 65–99)
Potassium: 4.6 mmol/L (ref 3.5–5.2)
Sodium: 140 mmol/L (ref 134–144)
Total Protein: 6.6 g/dL (ref 6.0–8.5)

## 2019-06-28 LAB — PSA: Prostate Specific Ag, Serum: 0.8 ng/mL (ref 0.0–4.0)

## 2019-06-28 LAB — LIPID PANEL
Chol/HDL Ratio: 4.9 ratio (ref 0.0–5.0)
Cholesterol, Total: 200 mg/dL — ABNORMAL HIGH (ref 100–199)
HDL: 41 mg/dL (ref >39)
LDL Chol Calc (NIH): 128 mg/dL — ABNORMAL HIGH (ref 0–99)
Triglycerides: 175 mg/dL — ABNORMAL HIGH (ref 0–149)
VLDL Cholesterol Cal: 31 mg/dL (ref 5–40)

## 2019-06-28 LAB — TSH: TSH: 2.85 u[IU]/mL (ref 0.450–4.500)

## 2019-07-04 ENCOUNTER — Telehealth: Payer: Self-pay

## 2019-07-04 NOTE — Telephone Encounter (Signed)
Patient advised.

## 2019-07-04 NOTE — Telephone Encounter (Signed)
-----   Message from [name], MD sent at 07/02/2019  8:07 AM EDT ----- Labs stable.

## 2019-08-15 ENCOUNTER — Other Ambulatory Visit: Payer: Self-pay | Admitting: Family Medicine

## 2019-09-26 NOTE — Progress Notes (Signed)
Trena Platt Cummings,acting as a scribe for Wilhemena Durie, MD.,have documented all relevant documentation on the behalf of Wilhemena Durie, MD,as directed by  Wilhemena Durie, MD while in the presence of Wilhemena Durie, MD.   Established patient visit   Patient: Terry Liu   DOB: [birth year]   64 y.o. Male  MRN: [id number] Visit Date: 09/30/2019  Today's healthcare provider: Wilhemena Durie, MD   Chief Complaint  Patient presents with  . Follow-up   Subjective    HPI   Patient presents today in office for a 3 month f/u on Neuropathy, patient is currently prescribed Gabapentin for this problem.  He is on 100 mg dose .  No relief at this dose.  Patient says that he stopped taking gabapentin about 5-6 weeks ago because he did not notice any difference while he was taking it.   Neuropathy From 06/27/2019-We will attempt to titrate up on gabapentin to try to help with pain./Discomfort.  It is a burning pain that affects him mainly at night and keeps him from sleeping some.   Past Medical History:  Diagnosis Date  . Arthritis    Osteoarthritis  . DVT of leg (deep venous thrombosis) ([place]) 2013   LLE/Dr Cynda Acres  . GERD (gastroesophageal reflux disease)   . History of blood clots 2014  . Hyperlipidemia   . Sleep apnea        Medications: Outpatient Medications Prior to Visit  Medication Sig  . ELIQUIS 5 MG TABS tablet TAKE 1 TABLET BY MOUTH TWICE DAILY  . gabapentin (NEURONTIN) 100 MG capsule Take 2 - 3 capsules by mouth at night (Patient not taking: Reported on 09/30/2019)  . metoprolol succinate (TOPROL-XL) 25 MG 24 hr tablet TAKE 1 TABLET(25 MG) BY MOUTH DAILY (Patient not taking: Reported on 03/08/2018)  . oseltamivir (TAMIFLU) 75 MG capsule Take 1 capsule (75 mg total) by mouth 2 (two) times daily.  . sildenafil (REVATIO) 20 MG tablet Take 1-5 tablets every day  . tadalafil (CIALIS) 20 MG tablet Take 0.5-1 tablets (10-20 mg total) by mouth every 3  (three) days as needed for erectile dysfunction.   No facility-administered medications prior to visit.    Review of Systems  Constitutional: Negative for appetite change, chills and fever.  Respiratory: Negative for chest tightness, shortness of breath and wheezing.   Cardiovascular: Negative for chest pain and palpitations.  Gastrointestinal: Negative for abdominal pain, nausea and vomiting.       Objective    BP 118/79 (BP Location: Left Arm, Patient Position: Sitting, Cuff Size: Large)   Pulse 65   Temp 98.3 F (36.8 C) (Oral)   Ht 6' (1.829 m)   Wt (!) 301 lb 12.8 oz (136.9 kg)   BMI 40.93 kg/m  BP Readings from Last 3 Encounters:  09/30/19 118/79  06/27/19 109/71  05/29/19 123/81   Wt Readings from Last 3 Encounters:  09/30/19 (!) 301 lb 12.8 oz (136.9 kg)  06/27/19 (!) 302 lb 3.2 oz (137.1 kg)  05/29/19 (!) 304 lb 6.4 oz (138.1 kg)      Physical Exam Vitals reviewed.  Constitutional:      Appearance: Normal appearance.  HENT:     Head: Normocephalic and atraumatic.     Right Ear: External ear normal.     Left Ear: External ear normal.  Eyes:     General: No scleral icterus.    Conjunctiva/sclera: Conjunctivae normal.  Cardiovascular:     Rate and  Rhythm: Normal rate and regular rhythm.     Pulses: Normal pulses.     Heart sounds: Normal heart sounds.  Pulmonary:     Effort: Pulmonary effort is normal.     Breath sounds: Normal breath sounds.  Musculoskeletal:     Right lower leg: No edema.     Left lower leg: No edema.  Skin:    General: Skin is warm and dry.  Neurological:     General: No focal deficit present.     Mental Status: He is alert and oriented to person, place, and time.  Psychiatric:        Mood and Affect: Mood normal.        Behavior: Behavior normal.        Thought Content: Thought content normal.        Judgment: Judgment normal.       No results found for any visits on 09/30/19.  Assessment & Plan     1.  Neuropathy Gabapentin at larger doses he tolerated lower dose fine.  Increase to 300 mg titrating to 3 times daily. - gabapentin (NEURONTIN) 300 MG capsule; Take 1 capsule (300 mg total) by mouth 3 (three) times daily.  Dispense: 90 capsule; Refill: 1  2. Obstructive sleep apnea syndrome   3. Class 2 severe obesity due to excess calories with serious comorbidity and body mass index (BMI) of 39.0 to 39.9 in adult [facility]) With sleep apnea and reflux and osteoarthritis  4. Osteoarthritis, unspecified osteoarthritis type, unspecified site   5. Gastroesophageal reflux disease, unspecified whether esophagitis present  6.  History of recurrent DVT  7.  Prolactinoma H/O microprolactinoma  Return in about 6 months (around 03/30/2020).         Rahima Fleishman Cranford Mon, MD  [facility] [phone] (phone) [phone] (fax)  [place]

## 2019-09-30 ENCOUNTER — Ambulatory Visit (INDEPENDENT_AMBULATORY_CARE_PROVIDER_SITE_OTHER): Payer: Medicare Other | Admitting: Family Medicine

## 2019-09-30 ENCOUNTER — Other Ambulatory Visit: Payer: Self-pay

## 2019-09-30 ENCOUNTER — Encounter: Payer: Self-pay | Admitting: Family Medicine

## 2019-09-30 VITALS — BP 118/79 | HR 65 | Temp 98.3°F | Ht 72.0 in | Wt 301.8 lb

## 2019-09-30 DIAGNOSIS — K219 Gastro-esophageal reflux disease without esophagitis: Secondary | ICD-10-CM | POA: Diagnosis not present

## 2019-09-30 DIAGNOSIS — M1990 Unspecified osteoarthritis, unspecified site: Secondary | ICD-10-CM | POA: Diagnosis not present

## 2019-09-30 DIAGNOSIS — Z6839 Body mass index (BMI) 39.0-39.9, adult: Secondary | ICD-10-CM

## 2019-09-30 DIAGNOSIS — G4733 Obstructive sleep apnea (adult) (pediatric): Secondary | ICD-10-CM

## 2019-09-30 DIAGNOSIS — G629 Polyneuropathy, unspecified: Secondary | ICD-10-CM | POA: Diagnosis not present

## 2019-09-30 DIAGNOSIS — Z86018 Personal history of other benign neoplasm: Secondary | ICD-10-CM

## 2019-09-30 DIAGNOSIS — I82403 Acute embolism and thrombosis of unspecified deep veins of lower extremity, bilateral: Secondary | ICD-10-CM

## 2019-09-30 MED ORDER — GABAPENTIN 300 MG PO CAPS: 300.0000 mg | ORAL_CAPSULE | Freq: Three times a day (TID) | ORAL | 1 refills | Status: DC

## 2019-10-08 ENCOUNTER — Other Ambulatory Visit: Payer: Self-pay | Admitting: General Surgery

## 2019-10-08 DIAGNOSIS — K429 Umbilical hernia without obstruction or gangrene: Secondary | ICD-10-CM | POA: Diagnosis not present

## 2019-10-08 NOTE — Progress Notes (Signed)
Subjective:     Patient ID: Terry Liu is a 64 y.o. male.  HPI  The following portions of the patient's history were reviewed and updated as appropriate. Past Medical History:  has a past medical history of Arthritis, DVT of leg (deep venous thrombosis) (CMS-HCC) (2013), GERD (gastroesophageal reflux disease), History of blood clots (2014), Hyperlipidemia, and Sleep apnea. Problem List: does not have a problem list on file. Past Surgical History:  has a past surgical history that includes REMOVAL OF BASAL CELL CARCINOMA (2017); Colonoscopy (2010, 04-09-19); and Tonsillectomy. Current Medications: has a current medication list which includes the following prescription(s): acetaminophen, apixaban, and gabapentin.  This an established patient is here today for: office visit. Patient is here today for evaluation of an umbilical hernia. He states he has noticed an umbilical knot for several years. He denies any pain to the area. He states that over the past 6 months it has gotten larger. Bowel move every other day. Colonoscopy was this year, he has a known hemorrhoid.  The patient was evaluated for this in November 2018.  Defect was estimated at 2 cm at that time.     Chief Complaint  Patient presents with  . Umbilical Hernia     BP 161/09   Pulse 77   Temp 36.9 C (98.4 F)   Ht 182.9 cm (6')   Wt (!) 134.7 kg (297 lb)   SpO2 96%   BMI 40.28 kg/m       Past Medical History:  Diagnosis Date  . Arthritis   . DVT of leg (deep venous thrombosis) (CMS-HCC) 2013  . GERD (gastroesophageal reflux disease)   . History of blood clots 2014  . Hyperlipidemia   . Sleep apnea    CPAP          Past Surgical History:  Procedure Laterality Date  . COLONOSCOPY  2010, 04-09-19  . REMOVAL OF BASAL CELL CARCINOMA  2017  . TONSILLECTOMY         Social History          Socioeconomic History  . Marital status: Unknown    Spouse name: Not on file  . Number of  children: Not on file  . Years of education: Not on file  . Highest education level: Not on file  Occupational History  . Not on file  Tobacco Use  . Smoking status: Never Smoker  . Smokeless tobacco: Never Used  Substance and Sexual Activity  . Alcohol use: No  . Drug use: Not on file  . Sexual activity: Not on file  Other Topics Concern  . Not on file  Social History Narrative  . Not on file   Social Determinants of Health      Financial Resource Strain:   . Difficulty of Paying Living Expenses:   Food Insecurity:   . Worried About Charity fundraiser in the Last Year:   . Arboriculturist in the Last Year:   Transportation Needs:   . Film/video editor (Medical):   [name] Lack of Transportation (Non-Medical):        No Known Allergies  Current Medications        Current Outpatient Medications  Medication Sig Dispense Refill  . acetaminophen (TYLENOL) 325 MG tablet Take 650 mg by mouth every 4 (four) hours as needed for Pain    . apixaban (ELIQUIS) 5 mg tablet Take 5 mg by mouth every 12 (twelve) hours.    . gabapentin (NEURONTIN)  300 MG capsule Take by mouth nightly     No current facility-administered medications for this visit.           Family History  Problem Relation Age of Onset  . High blood pressure (Hypertension) Mother   . Lung cancer Mother   . Breast cancer Mother   . Heart disease Father   . Myocardial Infarction (Heart attack) Father   . High blood pressure (Hypertension) Father   . Lung cancer Father   . Skin cancer Sister        melanoma  . Diabetes Sister        Review of Systems  Constitutional: Negative for chills and fever.  Respiratory: Negative for cough.        Objective:   Physical Exam Constitutional:      Appearance: Normal appearance.  Cardiovascular:     Rate and Rhythm: Normal rate and regular rhythm.     Pulses: Normal pulses.     Heart sounds: Normal heart sounds.  Pulmonary:      Effort: Pulmonary effort is normal.     Breath sounds: Normal breath sounds.  Abdominal:     Hernia: A hernia is present. Hernia is present in the umbilical area.    Neurological:     Mental Status: He is alert and oriented to person, place, and time.  Psychiatric:        Mood and Affect: Mood normal.        Behavior: Behavior normal.     Labs and Radiology:   CT scan of the abdomen and pelvis obtained October 25, 2013 was independently reviewed.  The study was completed for diverticulitis.  I measure the fascial defect at the umbilicus at 2.2 x 2.5 cm.  CBC and comprehensive metabolic panel dated Jun 27, 2019 was reviewed.  All normal.  Colonoscopy completed in March 2021 by Aundria Rud, MD described polyps, benign mucosa on pathologic review.  Diverticulosis.     Assessment:     Umbilical hernia, no significant interval change since 2015 on CT or 2018 on clinical exam.  Now clinically symptomatic.    Plan:     Indications for elective repair reviewed.  Role of prosthetic mesh based on the fascial defect size and the patient's weight was discussed.  Risk of infection reviewed.  He has been maintained on Eliquis for DVT that recurred while on Coumadin.  He did stop this during his recent colonoscopy without ill effect.  The patient will hold his Eliquis x2 days prior to the procedure.  Surgery tentatively scheduled for October 25, 2019.    Entered by Ledell Noss, CMA, acting as a scribe for Dr. Hervey Ard, MD.   The documentation recorded by the scribe accurately reflects the service I personally performed and the decisions made by me.   Robert Bellow, MD, PACS

## 2019-10-09 ENCOUNTER — Other Ambulatory Visit: Payer: Self-pay | Admitting: Family Medicine

## 2019-10-18 ENCOUNTER — Other Ambulatory Visit
Admission: RE | Admit: 2019-10-18 | Discharge: 2019-10-18 | Disposition: A | Discharge status: Home / Self Care | Payer: Medicare Other | Source: Ambulatory Visit | Attending: General Surgery | Admitting: General Surgery

## 2019-10-18 ENCOUNTER — Other Ambulatory Visit: Payer: Self-pay

## 2019-10-18 NOTE — Patient Instructions (Signed)
Your procedure is scheduled on: Friday October 25, 2019. Report to Day Surgery inside [facility] 2nd floor. To find out your arrival time please call [phone] between 1PM - 3PM on Thursday October 24, 2019.  Remember: Instructions that are not followed completely may result in serious medical risk,  up to and including death, or upon the discretion of your surgeon and anesthesiologist your  surgery may need to be rescheduled.     _X__ 1. Do not eat food after midnight the night before your procedure.                 No chewing gum or hard candies. You may drink clear liquids up to 2 hours                 before you are scheduled to arrive for your surgery- DO not drink clear                 liquids within 2 hours of the start of your surgery.                 Clear Liquids include:  water, apple juice without pulp, clear Gatorade, G2 or                  Gatorade Zero (avoid Red/Purple/Blue), Black Coffee or Tea (Do not add                 anything to coffee or tea).  __X__2.  On the morning of surgery brush your teeth with toothpaste and water, you                may rinse your mouth with mouthwash if you wish.  Do not swallow any toothpaste of mouthwash.     _X__ 3.  No Alcohol for 24 hours before or after surgery.   _X__ 4.  Do Not Smoke or use e-cigarettes For 24 Hours Prior to Your Surgery.                 Do not use any chewable tobacco products for at least 6 hours prior to                 Surgery.  _X__  5.  Do not use any recreational drugs (marijuana, cocaine, heroin, ecstasy, MDMA or other)                For at least one week prior to your surgery.  Combination of these drugs with anesthesia                May have life threatening results.  __x__ 6.  Notify your doctor if there is any change in your medical condition      (cold, fever, infections).     Do not wear jewelry, make-up, hairpins, clips or nail polish. Do not wear lotions,  powders, or perfumes. You may wear deodorant. Do not shave 48 hours prior to surgery. Men may shave face and neck. Do not bring valuables to the hospital.    [facility] is not responsible for any belongings or valuables.  Contacts, dentures or bridgework may not be worn into surgery. Leave your suitcase in the car. After surgery it may be brought to your room. For patients admitted to the hospital, discharge time is determined by your treatment team.   Patients discharged the day of surgery will not be allowed to drive home.   Make arrangements for someone to be  with you for the first 24 hours of your Same Day Discharge.   ____ Take these medicines the morning of surgery with A SIP OF WATER:    1. None    __x__ Use CHG Soap (or wipes) as directed  __x__ Stop ELIQUIS 5 MG, (10/22/19) as directed by your provider  __x__ Stop Anti-inflammatories such as Ibuprofen, Aleve, Advil, naproxen, and or BC powders.    __x__ Stop supplements until after surgery.    __x__ Do not start any herbal supplements before your procedure.   __x__ Bring C-Pap to the hospital. (if in good standing)   If you have any questions regarding your pre-procedure instructions,  Please call Pre-admit Testing at [phone].

## 2019-10-23 ENCOUNTER — Other Ambulatory Visit
Admission: RE | Admit: 2019-10-23 | Discharge: 2019-10-23 | Disposition: A | Discharge status: Home / Self Care | Payer: Medicare Other | Source: Ambulatory Visit | Attending: General Surgery | Admitting: General Surgery

## 2019-10-23 ENCOUNTER — Other Ambulatory Visit: Payer: Self-pay

## 2019-10-23 DIAGNOSIS — Z20822 Contact with and (suspected) exposure to covid-19: Secondary | ICD-10-CM | POA: Diagnosis not present

## 2019-10-23 DIAGNOSIS — Z01812 Encounter for preprocedural laboratory examination: Secondary | ICD-10-CM | POA: Diagnosis not present

## 2019-10-23 LAB — SARS CORONAVIRUS 2 (TAT 6-24 HRS): SARS Coronavirus 2: NEGATIVE

## 2019-10-25 ENCOUNTER — Ambulatory Visit: Payer: Medicare Other | Admitting: Certified Registered Nurse Anesthetist

## 2019-10-25 ENCOUNTER — Encounter: Payer: Self-pay | Admitting: General Surgery

## 2019-10-25 ENCOUNTER — Encounter
Admission: RE | Disposition: A | Discharge status: Home / Self Care | Payer: Self-pay | Source: Home / Self Care | Attending: General Surgery

## 2019-10-25 ENCOUNTER — Ambulatory Visit
Admission: RE | Admit: 2019-10-25 | Discharge: 2019-10-25 | Disposition: A | Discharge status: Home / Self Care | Payer: Medicare Other | Attending: General Surgery | Admitting: General Surgery

## 2019-10-25 DIAGNOSIS — Z79899 Other long term (current) drug therapy: Secondary | ICD-10-CM | POA: Insufficient documentation

## 2019-10-25 DIAGNOSIS — Z86718 Personal history of other venous thrombosis and embolism: Secondary | ICD-10-CM | POA: Insufficient documentation

## 2019-10-25 DIAGNOSIS — Z85828 Personal history of other malignant neoplasm of skin: Secondary | ICD-10-CM | POA: Insufficient documentation

## 2019-10-25 DIAGNOSIS — M1990 Unspecified osteoarthritis, unspecified site: Secondary | ICD-10-CM | POA: Insufficient documentation

## 2019-10-25 DIAGNOSIS — Z6839 Body mass index (BMI) 39.0-39.9, adult: Secondary | ICD-10-CM | POA: Diagnosis not present

## 2019-10-25 DIAGNOSIS — Z7901 Long term (current) use of anticoagulants: Secondary | ICD-10-CM | POA: Diagnosis not present

## 2019-10-25 DIAGNOSIS — E785 Hyperlipidemia, unspecified: Secondary | ICD-10-CM | POA: Diagnosis not present

## 2019-10-25 DIAGNOSIS — K219 Gastro-esophageal reflux disease without esophagitis: Secondary | ICD-10-CM | POA: Diagnosis not present

## 2019-10-25 DIAGNOSIS — K429 Umbilical hernia without obstruction or gangrene: Secondary | ICD-10-CM | POA: Insufficient documentation

## 2019-10-25 DIAGNOSIS — G4730 Sleep apnea, unspecified: Secondary | ICD-10-CM | POA: Insufficient documentation

## 2019-10-25 HISTORY — PX: INSERTION OF MESH: SHX5868

## 2019-10-25 HISTORY — PX: UMBILICAL HERNIA REPAIR: SHX196

## 2019-10-25 SURGERY — REPAIR, HERNIA, UMBILICAL, ADULT
Anesthesia: General

## 2019-10-25 MED ORDER — LIDOCAINE HCL 4 % MT SOLN
OROMUCOSAL | Status: DC | PRN
  Administered 2019-10-25: 4 mL via TOPICAL

## 2019-10-25 MED ORDER — METOCLOPRAMIDE HCL 5 MG/ML IJ SOLN: 10.0000 mg | Freq: Once | INTRAMUSCULAR | Status: AC

## 2019-10-25 MED ORDER — LIDOCAINE HCL (CARDIAC) PF 100 MG/5ML IV SOSY
PREFILLED_SYRINGE | INTRAVENOUS | Status: DC | PRN
  Administered 2019-10-25: 100 mg via INTRAVENOUS

## 2019-10-25 MED ORDER — OXYCODONE HCL 5 MG PO TABS: 5.0000 mg | ORAL_TABLET | Freq: Once | ORAL | Status: DC | PRN

## 2019-10-25 MED ORDER — DIPHENHYDRAMINE HCL 50 MG/ML IJ SOLN
INTRAMUSCULAR | Status: AC
  Filled 2019-10-25: qty 1

## 2019-10-25 MED ORDER — FENTANYL CITRATE (PF) 100 MCG/2ML IJ SOLN
INTRAMUSCULAR | Status: AC
  Administered 2019-10-25: 25 ug via INTRAVENOUS
  Filled 2019-10-25: qty 2

## 2019-10-25 MED ORDER — LACTATED RINGERS IV SOLN: INTRAVENOUS | Status: DC

## 2019-10-25 MED ORDER — BUPIVACAINE HCL 0.5 % IJ SOLN
INTRAMUSCULAR | Status: DC | PRN
  Administered 2019-10-25: 30 mL

## 2019-10-25 MED ORDER — MIDAZOLAM HCL 2 MG/2ML IJ SOLN
INTRAMUSCULAR | Status: DC | PRN
  Administered 2019-10-25: 2 mg via INTRAVENOUS

## 2019-10-25 MED ORDER — ACETAMINOPHEN 10 MG/ML IV SOLN
INTRAVENOUS | Status: AC
  Filled 2019-10-25: qty 100

## 2019-10-25 MED ORDER — KETAMINE HCL 50 MG/ML IJ SOLN
INTRAMUSCULAR | Status: DC | PRN
  Administered 2019-10-25: 50 mg via INTRAVENOUS

## 2019-10-25 MED ORDER — BUPIVACAINE HCL (PF) 0.5 % IJ SOLN
INTRAMUSCULAR | Status: AC
  Filled 2019-10-25: qty 30

## 2019-10-25 MED ORDER — FAMOTIDINE 20 MG PO TABS
20.0000 mg | ORAL_TABLET | Freq: Once | ORAL | Status: AC
  Administered 2019-10-25: 20 mg via ORAL

## 2019-10-25 MED ORDER — ONDANSETRON HCL 4 MG/2ML IJ SOLN
INTRAMUSCULAR | Status: AC
  Filled 2019-10-25: qty 2

## 2019-10-25 MED ORDER — ROCURONIUM BROMIDE 10 MG/ML (PF) SYRINGE
PREFILLED_SYRINGE | INTRAVENOUS | Status: AC
  Filled 2019-10-25: qty 10

## 2019-10-25 MED ORDER — CHLORHEXIDINE GLUCONATE 0.12 % MT SOLN
OROMUCOSAL | Status: AC
  Filled 2019-10-25: qty 15

## 2019-10-25 MED ORDER — HYDROCODONE-ACETAMINOPHEN 5-325 MG PO TABS: 1.0000 | ORAL_TABLET | ORAL | 0 refills | Status: AC | PRN

## 2019-10-25 MED ORDER — ORAL CARE MOUTH RINSE: 15.0000 mL | Freq: Once | OROMUCOSAL | Status: AC

## 2019-10-25 MED ORDER — PROPOFOL 10 MG/ML IV BOLUS
INTRAVENOUS | Status: AC
  Filled 2019-10-25: qty 20

## 2019-10-25 MED ORDER — FENTANYL CITRATE (PF) 100 MCG/2ML IJ SOLN
INTRAMUSCULAR | Status: AC
  Filled 2019-10-25: qty 2

## 2019-10-25 MED ORDER — CHLORHEXIDINE GLUCONATE 0.12 % MT SOLN
15.0000 mL | Freq: Once | OROMUCOSAL | Status: AC
  Administered 2019-10-25: 15 mL via OROMUCOSAL

## 2019-10-25 MED ORDER — APIXABAN 5 MG PO TABS
5.0000 mg | ORAL_TABLET | Freq: Two times a day (BID) | ORAL | 0 refills | Status: DC
Start: 2019-10-28 — End: 2019-11-13

## 2019-10-25 MED ORDER — ONDANSETRON HCL 4 MG/2ML IJ SOLN
INTRAMUSCULAR | Status: DC | PRN
  Administered 2019-10-25: 4 mg via INTRAVENOUS

## 2019-10-25 MED ORDER — PROPOFOL 10 MG/ML IV BOLUS
INTRAVENOUS | Status: DC | PRN
  Administered 2019-10-25: 170 mg via INTRAVENOUS

## 2019-10-25 MED ORDER — CEFAZOLIN SODIUM-DEXTROSE 2-4 GM/100ML-% IV SOLN
2.0000 g | INTRAVENOUS | Status: AC
  Administered 2019-10-25: 3 g via INTRAVENOUS

## 2019-10-25 MED ORDER — ROCURONIUM BROMIDE 100 MG/10ML IV SOLN
INTRAVENOUS | Status: DC | PRN
  Administered 2019-10-25: 60 mg via INTRAVENOUS

## 2019-10-25 MED ORDER — FENTANYL CITRATE (PF) 250 MCG/5ML IJ SOLN
INTRAMUSCULAR | Status: DC | PRN
Start: 2019-10-25 — End: 2019-10-25
  Administered 2019-10-25 (×4): 50 ug via INTRAVENOUS

## 2019-10-25 MED ORDER — OXYCODONE HCL 5 MG/5ML PO SOLN: 5.0000 mg | Freq: Once | ORAL | Status: DC | PRN

## 2019-10-25 MED ORDER — CEFAZOLIN SODIUM 1 G IJ SOLR
INTRAMUSCULAR | Status: AC
  Filled 2019-10-25: qty 10

## 2019-10-25 MED ORDER — FENTANYL CITRATE (PF) 100 MCG/2ML IJ SOLN
25.0000 ug | INTRAMUSCULAR | Status: DC | PRN
  Administered 2019-10-25 (×3): 25 ug via INTRAVENOUS

## 2019-10-25 MED ORDER — CEFAZOLIN SODIUM-DEXTROSE 2-4 GM/100ML-% IV SOLN
INTRAVENOUS | Status: AC
  Filled 2019-10-25: qty 100

## 2019-10-25 MED ORDER — FAMOTIDINE 20 MG PO TABS
ORAL_TABLET | ORAL | Status: AC
  Filled 2019-10-25: qty 1

## 2019-10-25 MED ORDER — ACETAMINOPHEN 10 MG/ML IV SOLN: 1000.0000 mg | Freq: Once | INTRAVENOUS | Status: DC | PRN

## 2019-10-25 MED ORDER — DIPHENHYDRAMINE HCL 50 MG/ML IJ SOLN
12.5000 mg | Freq: Once | INTRAMUSCULAR | Status: AC
  Administered 2019-10-25: 12.5 mg via INTRAVENOUS

## 2019-10-25 MED ORDER — SUGAMMADEX SODIUM 200 MG/2ML IV SOLN
INTRAVENOUS | Status: DC | PRN
  Administered 2019-10-25: 200 mg via INTRAVENOUS

## 2019-10-25 MED ORDER — LIDOCAINE HCL (PF) 2 % IJ SOLN
INTRAMUSCULAR | Status: AC
  Filled 2019-10-25: qty 5

## 2019-10-25 MED ORDER — DEXAMETHASONE SODIUM PHOSPHATE 10 MG/ML IJ SOLN
INTRAMUSCULAR | Status: AC
  Filled 2019-10-25: qty 1

## 2019-10-25 MED ORDER — ACETAMINOPHEN 10 MG/ML IV SOLN
INTRAVENOUS | Status: DC | PRN
  Administered 2019-10-25: 1000 mg via INTRAVENOUS

## 2019-10-25 MED ORDER — MIDAZOLAM HCL 2 MG/2ML IJ SOLN
INTRAMUSCULAR | Status: AC
  Filled 2019-10-25: qty 2

## 2019-10-25 MED ORDER — ONDANSETRON HCL 4 MG/2ML IJ SOLN
4.0000 mg | Freq: Once | INTRAMUSCULAR | Status: AC | PRN
  Administered 2019-10-25: 4 mg via INTRAVENOUS

## 2019-10-25 MED ORDER — DEXAMETHASONE SODIUM PHOSPHATE 10 MG/ML IJ SOLN
INTRAMUSCULAR | Status: DC | PRN
  Administered 2019-10-25: 10 mg via INTRAVENOUS

## 2019-10-25 MED ORDER — METOCLOPRAMIDE HCL 5 MG/ML IJ SOLN
INTRAMUSCULAR | Status: AC
  Administered 2019-10-25: 10 mg via INTRAVENOUS
  Filled 2019-10-25: qty 2

## 2019-10-25 SURGICAL SUPPLY — 33 items
APL PRP STRL LF DISP 70% ISPRP (MISCELLANEOUS) ×1
BLADE SURG 15 STRL SS SAFETY (BLADE) ×2 IMPLANT
CANISTER SUCT 1200ML W/VALVE (MISCELLANEOUS) ×2 IMPLANT
CHLORAPREP W/TINT 26 (MISCELLANEOUS) ×2 IMPLANT
COVER WAND RF STERILE (DRAPES) ×2 IMPLANT
DRAPE LAPAROTOMY 100X77 ABD (DRAPES) ×2 IMPLANT
DRSG TEGADERM 4X4.75 (GAUZE/BANDAGES/DRESSINGS) ×2 IMPLANT
DRSG TELFA 4X3 1S NADH ST (GAUZE/BANDAGES/DRESSINGS) ×2 IMPLANT
ELECT REM PT RETURN 9FT ADLT (ELECTROSURGICAL) ×2
ELECTRODE REM PT RTRN 9FT ADLT (ELECTROSURGICAL) ×1 IMPLANT
GLOVE BIO SURGEON STRL SZ7.5 (GLOVE) ×2 IMPLANT
GLOVE INDICATOR 8.0 STRL GRN (GLOVE) ×2 IMPLANT
GOWN STRL REUS W/ TWL LRG LVL3 (GOWN DISPOSABLE) ×2 IMPLANT
GOWN STRL REUS W/TWL LRG LVL3 (GOWN DISPOSABLE) ×4
KIT TURNOVER KIT A (KITS) ×2 IMPLANT
LABEL OR SOLS (LABEL) ×2 IMPLANT
MESH VENTRALEX ST 8CM LRG (Mesh General) ×1 IMPLANT
NDL HYPO 25X1 1.5 SAFETY (NEEDLE) ×1 IMPLANT
NEEDLE HYPO 22GX1.5 SAFETY (NEEDLE) ×4 IMPLANT
NEEDLE HYPO 25X1 1.5 SAFETY (NEEDLE) ×2 IMPLANT
NS IRRIG 500ML POUR BTL (IV SOLUTION) ×2 IMPLANT
PACK BASIN MINOR (MISCELLANEOUS) ×2 IMPLANT
STRIP CLOSURE SKIN 1/2X4 (GAUZE/BANDAGES/DRESSINGS) ×2 IMPLANT
SUT PROLENE 0 CT 1 30 (SUTURE) ×2 IMPLANT
SUT SURGILON 0 BLK (SUTURE) ×4 IMPLANT
SUT VIC AB 3-0 54X BRD REEL (SUTURE) ×2 IMPLANT
SUT VIC AB 3-0 BRD 54 (SUTURE) ×4
SUT VIC AB 3-0 SH 27 (SUTURE) ×2
SUT VIC AB 3-0 SH 27X BRD (SUTURE) ×1 IMPLANT
SUT VIC AB 4-0 FS2 27 (SUTURE) ×2 IMPLANT
SWABSTK COMLB BENZOIN TINCTURE (MISCELLANEOUS) ×2 IMPLANT
SYR 10ML LL (SYRINGE) ×2 IMPLANT
SYR 3ML LL SCALE MARK (SYRINGE) ×2 IMPLANT

## 2019-10-25 NOTE — Anesthesia Preprocedure Evaluation (Signed)
Anesthesia Evaluation  Patient identified by MRN, date of birth, ID band Patient awake    Reviewed: Allergy & Precautions, NPO status , Patient's Chart, lab work & pertinent test results  History of Anesthesia Complications Negative for: history of anesthetic complications  Airway Mallampati: III  TM Distance: >3 FB Neck ROM: Full    Dental no notable dental hx. (+) Teeth Intact, Dental Advisory Given   Pulmonary sleep apnea and Continuous Positive Airway Pressure Ventilation , neg COPD, Patient abstained from smoking.Not current smoker,    Pulmonary exam normal breath sounds clear to auscultation       Cardiovascular Exercise Tolerance: Good METS(-) hypertension(-) CAD and (-) Past MI negative cardio ROS  (-) dysrhythmias  Rhythm:Regular Rate:Normal - Systolic murmurs    Neuro/Psych negative neurological ROS  negative psych ROS   GI/Hepatic GERD  Controlled,(+)     (-) substance abuse  ,   Endo/Other  neg diabetesMorbid obesity  Renal/GU negative Renal ROS     Musculoskeletal  (+) Arthritis , Osteoarthritis,    Abdominal (+) + obese,   Peds  Hematology DVT on eliquis   Anesthesia Other Findings Past Medical History: No date: Arthritis     Comment:  Osteoarthritis 2013: DVT of leg (deep venous thrombosis) (HCC)      Comment:  LLE/Dr Cynda Acres No date: GERD (gastroesophageal reflux disease) 2014: History of blood clots No date: Hyperlipidemia No date: Sleep apnea  Reproductive/Obstetrics                             Anesthesia Physical  Anesthesia Plan  ASA: III  Anesthesia Plan: General   Post-op Pain Management:    Induction: Intravenous  PONV Risk Score and Plan: 3 and Ondansetron, Propofol infusion, TIVA and Midazolam  Airway Management Planned: Oral ETT  Additional Equipment: None  Intra-op Plan:   Post-operative Plan: Extubation in OR  Informed Consent: I have  reviewed the patients History and Physical, chart, labs and discussed the procedure including the risks, benefits and alternatives for the proposed anesthesia with the patient or authorized representative who has indicated his/her understanding and acceptance.     Dental advisory given  Plan Discussed with: CRNA and Surgeon  Anesthesia Plan Comments: (Discussed risks of anesthesia with patient, including possibility of difficulty with spontaneous ventilation under anesthesia necessitating airway intervention, PONV, and rare risks such as cardiac or respiratory or neurological events. Patient understands.)        Anesthesia Quick Evaluation

## 2019-10-25 NOTE — Anesthesia Procedure Notes (Signed)
Procedure Name: Intubation Date/Time: 10/25/2019 1:49 PM Performed by: , , CRNA Pre-anesthesia Checklist: Patient identified, Emergency Drugs available, Suction available and Patient being monitored Patient Re-evaluated:Patient Re-evaluated prior to induction Oxygen Delivery Method: Circle system utilized Preoxygenation: Pre-oxygenation with 100% oxygen Induction Type: IV induction Ventilation: Mask ventilation without difficulty and Oral airway inserted - appropriate to patient size Laryngoscope Size: McGraph and 4 (Poor view with Miller 2) Grade View: Grade I Tube type: Oral Tube size: 7.5 mm Number of attempts: 2 Airway Equipment and Method: Stylet,  Oral airway,  Video-laryngoscopy and LTA kit utilized Placement Confirmation: ETT inserted through vocal cords under direct vision,  positive ETCO2 and breath sounds checked- equal and bilateral Secured at: 23 cm Tube secured with: Tape Dental Injury: Teeth and Oropharynx as per pre-operative assessment  Difficulty Due To: Difficult Airway- due to anterior larynx Comments: Poor visualization with Miller 2.       

## 2019-10-25 NOTE — Transfer of Care (Signed)
Immediate Anesthesia Transfer of Care Note  Patient: Terry Liu  Procedure(s) Performed: HERNIA REPAIR UMBILICAL ADULT (N/A ) INSERTION OF MESH (N/A )  Patient Location: PACU  Anesthesia Type:General  Level of Consciousness: awake, alert  and patient cooperative  Airway & Oxygen Therapy: Patient Spontanous Breathing and Patient connected to face mask oxygen  Post-op Assessment: Report given to RN and Post -op Vital signs reviewed and stable  Post vital signs: Reviewed and stable  Last Vitals:  Vitals Value Taken Time  BP 169/90 10/25/19 1441  Temp    Pulse 77 10/25/19 1442  Resp 11 10/25/19 1442  SpO2 99 % 10/25/19 1442  Vitals shown include unvalidated device data.  Last Pain:  Vitals:   10/25/19 1236  TempSrc: Temporal  PainSc: 0-No pain         Complications: No complications documented.

## 2019-10-25 NOTE — Op Note (Signed)
Preoperative diagnosis: Umbilical hernia.  Postoperative diagnosis: Same.  Operative procedure: Open umbilical hernia repair with 8 cm Ventralex mesh.  Operating surgeon: Hervey Ard, MD.  Anesthesia: General endotracheal, Marcaine 0.5%, plain 30 cc.  Estimated blood loss: Less than 5 cc.  Clinical note: This 64 year old male has developed an enlarging umbilical hernia.  He was admitted for elective repair.  He was felt to be a candidate for mesh reinforcement based on his BMI.  Estimated fascial defect is greater than 2 cm.  He received Ancef, 3 g intravenously prior to the procedure.  SCD stockings for DVT prevention.  Operative note: With the patient under adequate general anesthesia the area was cleansed with ChloraPrep and draped.  Hered previously been removed with clippers.  An infraumbilical incision was made and carried down through skin subtendinous tissue.  Hemostasis was electrocautery.  The umbilical skin was elevated off the hernia sac.  The sac was excised at the fascial level and discarded.  The fascial defect was greater than 2 cm in diameter.  An 8 cm Ventralex mesh was chosen.  This was transfixed at 6 points well away from the actual fascial defect with transfascial sutures of 0 Surgilon.  This brought up the size of the actual fascial defect down to about 1 cm.  This was closed with interrupted 0 Surgilon sutures.  The umbilical skin was tacked to the fascia with a 3-0 Vicryl figure-of-eight suture.  The adipose layer was approximated with a running 3-0 Vicryl suture.  The skin was closed with a running 4-0 Vicryl subcuticular suture.  Benzoin, Steri-Strips, Telfa and Tegaderm dressing was applied.  The patient tolerated the procedure well and was taken recovery room in stable condition.`

## 2019-10-25 NOTE — Discharge Instructions (Signed)

## 2019-10-25 NOTE — Progress Notes (Signed)
Patient is exhibiting nauseau after given Zofran. Called Dr. Lubertha Basque and she has ordered 10 mg of Reglan. She wants the patient to be watched for approximately 30 minutes after administration. We will monitor him closely for adverse reactions,

## 2019-10-27 NOTE — H&P (Signed)
Subjective:     Patient ID: Terry Liu is a 64 y.o. male.  HPI  The following portions of the patient's history were reviewed and updated as appropriate. Past Medical History:  has a past medical history of Arthritis, DVT of leg (deep venous thrombosis) (CMS-HCC) (2013), GERD (gastroesophageal reflux disease), History of blood clots (2014), Hyperlipidemia, and Sleep apnea. Problem List: does not have a problem list on file. Past Surgical History:  has a past surgical history that includes REMOVAL OF BASAL CELL CARCINOMA (2017); Colonoscopy (2010, 04-09-19); and Tonsillectomy. Current Medications: has a current medication list which includes the following prescription(s): acetaminophen, apixaban, and gabapentin.  This an established patient is here today for: office visit. Patient is here today for evaluation of an umbilical hernia. Terry Liu states Terry Liu has noticed an umbilical knot for several years. Terry Liu denies any pain to the area. Terry Liu states that over the past 6 months it has gotten larger. Bowel move every other day. Colonoscopy was this year, Terry Liu has a known hemorrhoid.  The patient was evaluated for this in November 2018.  Defect was estimated at 2 cm at that time.     Chief Complaint  Patient presents with  . Umbilical Hernia     BP 299/24   Pulse 77   Temp 36.9 C (98.4 F)   Ht 182.9 cm (6')   Wt (!) 134.7 kg (297 lb)   SpO2 96%   BMI 40.28 kg/m       Past Medical History:  Diagnosis Date  . Arthritis   . DVT of leg (deep venous thrombosis) (CMS-HCC) 2013  . GERD (gastroesophageal reflux disease)   . History of blood clots 2014  . Hyperlipidemia   . Sleep apnea    CPAP          Past Surgical History:  Procedure Laterality Date  . COLONOSCOPY  2010, 04-09-19  . REMOVAL OF BASAL CELL CARCINOMA  2017  . TONSILLECTOMY         Social History          Socioeconomic History  . Marital status: Unknown    Spouse name: Not on file  . Number of  children: Not on file  . Years of education: Not on file  . Highest education level: Not on file  Occupational History  . Not on file  Tobacco Use  . Smoking status: Never Smoker  . Smokeless tobacco: Never Used  Substance and Sexual Activity  . Alcohol use: No  . Drug use: Not on file  . Sexual activity: Not on file  Other Topics Concern  . Not on file  Social History Narrative  . Not on file   Social Determinants of Health      Financial Resource Strain:   . Difficulty of Paying Living Expenses:   Food Insecurity:   . Worried About Charity fundraiser in the Last Year:   . Arboriculturist in the Last Year:   Transportation Needs:   . Film/video editor (Medical):   [name] Lack of Transportation (Non-Medical):        No Known Allergies  Current Medications        Current Outpatient Medications  Medication Sig Dispense Refill  . acetaminophen (TYLENOL) 325 MG tablet Take 650 mg by mouth every 4 (four) hours as needed for Pain    . apixaban (ELIQUIS) 5 mg tablet Take 5 mg by mouth every 12 (twelve) hours.    . gabapentin (NEURONTIN)  300 MG capsule Take by mouth nightly     No current facility-administered medications for this visit.           Family History  Problem Relation Age of Onset  . High blood pressure (Hypertension) Mother   . Lung cancer Mother   . Breast cancer Mother   . Heart disease Father   . Myocardial Infarction (Heart attack) Father   . High blood pressure (Hypertension) Father   . Lung cancer Father   . Skin cancer Sister        melanoma  . Diabetes Sister        Review of Systems  Constitutional: Negative for chills and fever.  Respiratory: Negative for cough.        Objective:   Physical Exam Constitutional:      Appearance: Normal appearance.  Cardiovascular:     Rate and Rhythm: Normal rate and regular rhythm.     Pulses: Normal pulses.     Heart sounds: Normal heart sounds.  Pulmonary:      Effort: Pulmonary effort is normal.     Breath sounds: Normal breath sounds.  Abdominal:     Hernia: A hernia is present. Hernia is present in the umbilical area.    Neurological:     Mental Status: Terry Liu is alert and oriented to person, place, and time.  Psychiatric:        Mood and Affect: Mood normal.        Behavior: Behavior normal.     Labs and Radiology:   CT scan of the abdomen and pelvis obtained October 25, 2013 was independently reviewed.  The study was completed for diverticulitis.  I measure the fascial defect at the umbilicus at 2.2 x 2.5 cm.  CBC and comprehensive metabolic panel dated Jun 27, 2019 was reviewed.  All normal.  Colonoscopy completed in March 2021 by Aundria Rud, MD described polyps, benign mucosa on pathologic review.  Diverticulosis.     Assessment:     Umbilical hernia, no significant interval change since 2015 on CT or 2018 on clinical exam.  Now clinically symptomatic.    Plan:     Indications for elective repair reviewed.  Role of prosthetic mesh based on the fascial defect size and the patient's weight was discussed.  Risk of infection reviewed.  Terry Liu has been maintained on Eliquis for DVT that recurred while on Coumadin.  Terry Liu did stop this during his recent colonoscopy without ill effect.  The patient will hold his Eliquis x2 days prior to the procedure.  Surgery tentatively scheduled for October 25, 2019.    Entered by Ledell Noss, CMA, acting as a scribe for Dr. Hervey Ard, MD.   The documentation recorded by the scribe accurately reflects the service I personally performed and the decisions made by me.   Robert Bellow, MD FACS     Electronically signed by Mayer Masker, MD on 10/08/2019 12:50 PM   No change in clinical history or exam.

## 2019-10-28 ENCOUNTER — Encounter: Payer: Self-pay | Admitting: General Surgery

## 2019-10-29 NOTE — Anesthesia Postprocedure Evaluation (Signed)
Anesthesia Post Note  Patient: Terry Liu  Procedure(s) Performed: HERNIA REPAIR UMBILICAL ADULT (N/A ) INSERTION OF MESH (N/A )  Patient location during evaluation: PACU Anesthesia Type: General Level of consciousness: awake and alert Pain management: pain level controlled Vital Signs Assessment: post-procedure vital signs reviewed and stable Respiratory status: spontaneous breathing, nonlabored ventilation, respiratory function stable and patient connected to nasal cannula oxygen Cardiovascular status: blood pressure returned to baseline and stable Postop Assessment: no apparent nausea or vomiting Anesthetic complications: no   No complications documented.   Last Vitals:  Vitals:   10/25/19 1606 10/25/19 1725  BP: (!) 152/83 (!) 144/83  Pulse: 88 76  Resp: 16 14  Temp: (!) 36.2 C   SpO2: 93%     Last Pain:  Vitals:   10/28/19 0818  TempSrc:   PainSc: 0-No pain                 Arita Miss

## 2019-11-13 ENCOUNTER — Other Ambulatory Visit: Payer: Self-pay | Admitting: Family Medicine

## 2019-11-13 DIAGNOSIS — L821 Other seborrheic keratosis: Secondary | ICD-10-CM | POA: Diagnosis not present

## 2019-11-13 DIAGNOSIS — L918 Other hypertrophic disorders of the skin: Secondary | ICD-10-CM | POA: Diagnosis not present

## 2019-11-13 DIAGNOSIS — L570 Actinic keratosis: Secondary | ICD-10-CM | POA: Diagnosis not present

## 2019-11-13 NOTE — Telephone Encounter (Signed)
Requested medication (s) are due for refill today: Yes  Requested medication (s) are on the active medication list: Yes  Last refill:  10/25/19  Future visit scheduled: Yes  Notes to clinic:  Last filled by Dr. Bary Castilla.    Requested Prescriptions  Pending Prescriptions Disp Refills   ELIQUIS 5 MG TABS tablet [Pharmacy Med Name: ELIQUIS 5MG  TABLETS] 60 tablet 0    Sig: TAKE 1 TABLET BY MOUTH TWICE DAILY      Hematology:  Anticoagulants Passed - 11/13/2019  7:12 AM      Passed - HGB in normal range and within 360 days    Hemoglobin  Date Value Ref Range Status  06/27/2019 14.6 13.0 - 17.7 g/dL Final          Passed - PLT in normal range and within 360 days    Platelets  Date Value Ref Range Status  06/27/2019 254 150 - 450 x10E3/uL Final          Passed - HCT in normal range and within 360 days    Hematocrit  Date Value Ref Range Status  06/27/2019 42.7 37.5 - 51.0 % Final          Passed - Cr in normal range and within 360 days    Creat  Date Value Ref Range Status  12/14/2016 1.02 0.70 - 1.25 mg/dL Final    Comment:    For patients >27 years of age, the reference limit for Creatinine is approximately 13% higher for people identified as African-American. .    Creatinine, Ser  Date Value Ref Range Status  06/27/2019 1.14 0.76 - 1.27 mg/dL Final          Passed - Valid encounter within last 12 months    Recent Outpatient Visits           1 month ago Neuropathy   [facility] [name], MD   4 months ago Neuropathy   [facility] [name], MD   5 months ago Welcome to [facility] preventive visit   [facility] [name], [facility]   8 months ago Erectile dysfunction, unspecified erectile dysfunction type   [facility] [name], MD   1 year ago Annual physical exam   [facility] [name], MD       Future Appointments              In 4 months [name], MD [facility]

## 2019-11-20 ENCOUNTER — Other Ambulatory Visit: Payer: Self-pay | Admitting: Family Medicine

## 2020-01-12 ENCOUNTER — Other Ambulatory Visit: Payer: Self-pay | Admitting: Family Medicine

## 2020-01-12 NOTE — Telephone Encounter (Signed)
Requested Prescriptions  Pending Prescriptions Disp Refills  . ELIQUIS 5 MG TABS tablet [Pharmacy Med Name: ELIQUIS 5MG  TABLETS] 180 tablet 0    Sig: TAKE 1 TABLET BY MOUTH TWICE DAILY     Hematology:  Anticoagulants Passed - 01/12/2020  7:08 AM      Passed - HGB in normal range and within 360 days    Hemoglobin  Date Value Ref Range Status  06/27/2019 14.6 13.0 - 17.7 g/dL Final         Passed - PLT in normal range and within 360 days    Platelets  Date Value Ref Range Status  06/27/2019 254 150 - 450 x10E3/uL Final         Passed - HCT in normal range and within 360 days    Hematocrit  Date Value Ref Range Status  06/27/2019 42.7 37.5 - 51.0 % Final         Passed - Cr in normal range and within 360 days    Creat  Date Value Ref Range Status  12/14/2016 1.02 0.70 - 1.25 mg/dL Final    Comment:    For patients >12 years of age, the reference limit for Creatinine is approximately 13% higher for people identified as African-American. .    Creatinine, Ser  Date Value Ref Range Status  06/27/2019 1.14 0.76 - 1.27 mg/dL Final         Passed - Valid encounter within last 12 months    Recent Outpatient Visits          3 months ago Neuropathy   [facility] [name], MD   6 months ago Neuropathy   [facility] [name], MD   7 months ago [facility] to [facility] visit   [facility] [name], MD   10 months ago Erectile dysfunction, unspecified erectile dysfunction type   [facility] [name], MD   1 year ago Annual physical exam   [facility] [name], MD      Future Appointments            In 2 months [name], MD [facility] [place]

## 2020-02-17 ENCOUNTER — Ambulatory Visit: Payer: Self-pay | Admitting: *Deleted

## 2020-02-17 NOTE — Telephone Encounter (Signed)
I returned pt's call.   He did a home test on Friday and it was negative.   He did another home test on Saturday because he was still feeling bad and it was positive.   He is requesting quarantine information.  I went over the latest CDC guidelines with him and answered his questions.  He thanked me very much for my help.  Reason for Disposition . [1] [id number] diagnosed by positive lab test (e.g., PCR, rapid self-test kit) AND [2] mild symptoms (e.g., cough, fever, others) AND [4] no complications or SOB  Answer Assessment - Initial Assessment Questions 1. COVID-19 DIAGNOSIS: "Who made your COVID-19 diagnosis?" "Was it confirmed by a positive lab test?" If not diagnosed by a HCP, ask "Are there lots of cases (community spread) where you live?" Note: See public health department website, if unsure.     He took at home covid test.   I've had both vaccines and and the booster but I started feeling bad. 2. COVID-19 EXPOSURE: "Was there any known exposure to COVID before the symptoms began?" CDC Definition of close contact: within 6 feet (2 meters) for a total of 15 minutes or more over a 24-hour period.      *No Answer* 3. ONSET: "When did the COVID-19 symptoms start?"      Thur.  Coughing, sore throat then Friday nasal congestion, fever 100,  Yesterday afternoon started feeling better. 4. WORST SYMPTOM: "What is your worst symptom?" (e.g., cough, fever, shortness of breath, muscle aches)     Nasal congestion 5. COUGH: "Do you have a cough?" If Yes, ask: "How bad is the cough?"       Yes but it's better today 6. FEVER: "Do you have a fever?" If Yes, ask: "What is your temperature, how was it measured, and when did it start?"     Not now but I did 7. RESPIRATORY STATUS: "Describe your breathing?" (e.g., shortness of breath, wheezing, unable to speak)      "Fine" 8. BETTER-SAME-WORSE: "Are you getting better, staying the same or getting worse compared to yesterday?"  If getting worse, ask, "In what  way?"     Much better starting yesterday afternoon. 9. HIGH RISK DISEASE: "Do you have any chronic medical problems?" (e.g., asthma, heart or lung disease, weak immune system, obesity, etc.)     Not asked 10. VACCINE: "Have you gotten the COVID-19 vaccine?" If Yes ask: "Which one, how many shots, when did you get it?"       Yes both vaccines and the booster. 11. PREGNANCY: "Is there any chance you are pregnant?" "When was your last menstrual period?"       N/A 12. OTHER SYMPTOMS: "Do you have any other symptoms?"  (e.g., chills, fatigue, headache, loss of smell or taste, muscle pain, sore throat; new loss of smell or taste especially support the diagnosis of COVID-19)       Fatigue, sore throat, nasal congestion, did have a sore throat.   Still coughing but it's better.  Protocols used: CORONAVIRUS (COVID-19) DIAGNOSED OR SUSPECTED-A-AH

## 2020-02-21 ENCOUNTER — Encounter: Payer: Self-pay | Admitting: Family Medicine

## 2020-03-18 DIAGNOSIS — L570 Actinic keratosis: Secondary | ICD-10-CM | POA: Diagnosis not present

## 2020-03-18 DIAGNOSIS — L821 Other seborrheic keratosis: Secondary | ICD-10-CM | POA: Diagnosis not present

## 2020-03-30 ENCOUNTER — Ambulatory Visit: Payer: Self-pay | Admitting: Family Medicine

## 2020-04-21 ENCOUNTER — Ambulatory Visit: Payer: Self-pay | Admitting: Family Medicine

## 2020-06-10 ENCOUNTER — Other Ambulatory Visit: Payer: Self-pay | Admitting: Family Medicine

## 2020-06-10 NOTE — Telephone Encounter (Signed)
  Notes to clinic:  Looks like patient maybe taking different than prescribed  Review directions for refill    Requested Prescriptions  Pending Prescriptions Disp Refills   ELIQUIS 5 MG TABS tablet [Pharmacy Med Name: ELIQUIS 5MG  TABLETS] 180 tablet 0    Sig: TAKE 1 TABLET BY MOUTH TWICE DAILY      Hematology:  Anticoagulants Passed - 06/10/2020  7:38 AM      Passed - HGB in normal range and within 360 days    Hemoglobin  Date Value Ref Range Status  06/27/2019 14.6 13.0 - 17.7 g/dL Final          Passed - PLT in normal range and within 360 days    Platelets  Date Value Ref Range Status  06/27/2019 254 150 - 450 x10E3/uL Final          Passed - HCT in normal range and within 360 days    Hematocrit  Date Value Ref Range Status  06/27/2019 42.7 37.5 - 51.0 % Final          Passed - Cr in normal range and within 360 days    Creat  Date Value Ref Range Status  12/14/2016 1.02 0.70 - 1.25 mg/dL Final    Comment:    For patients >25 years of age, the reference limit for Creatinine is approximately 13% higher for people identified as African-American. .    Creatinine, Ser  Date Value Ref Range Status  06/27/2019 1.14 0.76 - 1.27 mg/dL Final          Passed - Valid encounter within last 12 months    Recent Outpatient Visits           8 months ago Neuropathy   [facility] [name], MD   11 months ago Neuropathy   [facility] [name], MD   1 year ago Welcome to [facility] preventive visit   [facility] [name], MD   1 year ago Erectile dysfunction, unspecified erectile dysfunction type   [facility] [name], MD   2 years ago Annual physical exam   [facility] [name], MD

## 2020-06-30 ENCOUNTER — Other Ambulatory Visit: Payer: Self-pay | Admitting: Family Medicine

## 2020-06-30 DIAGNOSIS — G629 Polyneuropathy, unspecified: Secondary | ICD-10-CM

## 2020-06-30 NOTE — Telephone Encounter (Signed)
   Notes to clinic:  medication dose requested is not on current medication list  Review for continued use    Requested Prescriptions  Pending Prescriptions Disp Refills   gabapentin (NEURONTIN) 100 MG capsule [Pharmacy Med Name: GABAPENTIN 100MG  CAPSULES] 60 capsule 5    Sig: TAKE 2 TO 3 CAPSULES BY MOUTH AT NIGHT      Neurology: Anticonvulsants - gabapentin Passed - 06/30/2020  8:35 AM      Passed - Valid encounter within last 12 months    Recent Outpatient Visits           9 months ago Neuropathy   [facility] [name], MD   1 year ago Neuropathy   [facility] [name], MD   1 year ago Welcome to [facility] preventive visit   [facility] [name], MD   1 year ago Erectile dysfunction, unspecified erectile dysfunction type   [facility] [name], MD   2 years ago Annual physical exam   [facility] [name], MD

## 2020-07-10 DIAGNOSIS — L821 Other seborrheic keratosis: Secondary | ICD-10-CM | POA: Diagnosis not present

## 2020-07-10 DIAGNOSIS — L578 Other skin changes due to chronic exposure to nonionizing radiation: Secondary | ICD-10-CM | POA: Diagnosis not present

## 2020-07-10 DIAGNOSIS — L570 Actinic keratosis: Secondary | ICD-10-CM | POA: Diagnosis not present

## 2020-07-10 DIAGNOSIS — Z86018 Personal history of other benign neoplasm: Secondary | ICD-10-CM | POA: Diagnosis not present

## 2020-07-10 DIAGNOSIS — Z872 Personal history of diseases of the skin and subcutaneous tissue: Secondary | ICD-10-CM | POA: Diagnosis not present

## 2020-07-10 DIAGNOSIS — L918 Other hypertrophic disorders of the skin: Secondary | ICD-10-CM | POA: Diagnosis not present

## 2020-07-10 DIAGNOSIS — Z85828 Personal history of other malignant neoplasm of skin: Secondary | ICD-10-CM | POA: Diagnosis not present

## 2020-07-10 DIAGNOSIS — D225 Melanocytic nevi of trunk: Secondary | ICD-10-CM | POA: Diagnosis not present

## 2020-09-29 ENCOUNTER — Other Ambulatory Visit: Payer: Self-pay

## 2020-09-29 ENCOUNTER — Encounter: Payer: Self-pay | Admitting: Family Medicine

## 2020-09-29 ENCOUNTER — Ambulatory Visit (INDEPENDENT_AMBULATORY_CARE_PROVIDER_SITE_OTHER): Payer: Medicare Other | Admitting: Family Medicine

## 2020-09-29 VITALS — BP 128/84 | HR 69 | Temp 98.2°F | Ht 72.0 in | Wt 303.4 lb

## 2020-09-29 DIAGNOSIS — E782 Mixed hyperlipidemia: Secondary | ICD-10-CM

## 2020-09-29 DIAGNOSIS — I825Z9 Chronic embolism and thrombosis of unspecified deep veins of unspecified distal lower extremity: Secondary | ICD-10-CM

## 2020-09-29 DIAGNOSIS — I491 Atrial premature depolarization: Secondary | ICD-10-CM | POA: Diagnosis not present

## 2020-09-29 DIAGNOSIS — Z125 Encounter for screening for malignant neoplasm of prostate: Secondary | ICD-10-CM

## 2020-09-29 DIAGNOSIS — Z6839 Body mass index (BMI) 39.0-39.9, adult: Secondary | ICD-10-CM

## 2020-09-29 DIAGNOSIS — H9190 Unspecified hearing loss, unspecified ear: Secondary | ICD-10-CM | POA: Diagnosis not present

## 2020-09-29 DIAGNOSIS — G629 Polyneuropathy, unspecified: Secondary | ICD-10-CM | POA: Diagnosis not present

## 2020-09-29 DIAGNOSIS — G4733 Obstructive sleep apnea (adult) (pediatric): Secondary | ICD-10-CM

## 2020-09-29 DIAGNOSIS — Z0000 Encounter for general adult medical examination without abnormal findings: Secondary | ICD-10-CM

## 2020-09-29 LAB — POCT URINALYSIS DIPSTICK
Appearance: NORMAL
Bilirubin, UA: NEGATIVE
Blood, UA: NEGATIVE
Glucose, UA: NEGATIVE
Ketones, UA: NEGATIVE
Leukocytes, UA: NEGATIVE
Nitrite, UA: NEGATIVE
Odor: NORMAL
Protein, UA: NEGATIVE
Spec Grav, UA: 1.01 (ref 1.010–1.025)
Urobilinogen, UA: 0.2 E.U./dL
pH, UA: 5 (ref 5.0–8.0)

## 2020-09-29 MED ORDER — GABAPENTIN 300 MG PO CAPS: 600.0000 mg | ORAL_CAPSULE | Freq: Every day | ORAL | 3 refills | Status: DC

## 2020-09-29 NOTE — Progress Notes (Signed)
Annual Wellness Visit     Patient: Terry Liu, Male    DOB: 08-May-1955, 65 y.o.   MRN: [id number] Visit Date: 09/29/2020  Today's Provider: Wilhemena Durie, MD   Chief Complaint  Patient presents with   Annual Exam   Subjective    Terry Liu is a 65 y.o. male who presents today for his Annual Wellness Visit..Annual Physical. He reports consuming a general diet. Her reports going walking at least twice a week for 20-25 minutes. He generally feels well. He reports sleeping well. He does have additional problems to discuss today.   HPI  Wants to talk concerning his tinnitus and neuropathy.  Most slowly getting worse.  Neuropathy is worse on the right than the left.  Tinnitus is worse on the left than the right.     Medications: Outpatient Medications Prior to Visit  Medication Sig   ELIQUIS 5 MG TABS tablet TAKE 1 TABLET BY MOUTH TWICE DAILY   oxymetazoline (AFRIN) 0.05 % nasal spray Place 1 spray into both nostrils 2 (two) times daily as needed for congestion.   [DISCONTINUED] gabapentin (NEURONTIN) 300 MG capsule Take 1 capsule (300 mg total) by mouth 3 (three) times daily. (Patient taking differently: Take 300 mg by mouth at bedtime.)   acetaminophen (TYLENOL) 325 MG tablet Take 650 mg by mouth every 6 (six) hours as needed for moderate pain.   ELIQUIS 5 MG TABS tablet TAKE 1 TABLET BY MOUTH TWICE DAILY   HYDROcodone-acetaminophen (NORCO/VICODIN) 5-325 MG tablet Take 1 tablet by mouth every 4 (four) hours as needed for moderate pain.   metoprolol succinate (TOPROL-XL) 25 MG 24 hr tablet TAKE 1 TABLET(25 MG) BY MOUTH DAILY (Patient not taking: No sig reported)   [DISCONTINUED] gabapentin (NEURONTIN) 100 MG capsule TAKE 2 TO 3 CAPSULES BY MOUTH AT NIGHT   No facility-administered medications prior to visit.    No Known Allergies  Patient Care Team: [name], MD as PCP - General (Family Medicine) [name], MD (Family  Medicine) Bary Castilla, Forest Gleason, MD (General Surgery) Minna Merritts, MD as Consulting Physician (Cardiology)  Review of Systems  HENT:  Positive for tinnitus.   All other systems reviewed and are negative.       Objective    Vitals: BP 128/84 (BP Location: Left Arm, Patient Position: Sitting, Cuff Size: Large)   Pulse 69   Temp 98.2 F (36.8 C) (Oral)   Ht 6' (1.829 m)   Wt (!) 303 lb 6.4 oz (137.6 kg)   SpO2 98%   BMI 41.15 kg/m  BP Readings from Last 3 Encounters:  09/29/20 128/84  10/25/19 (!) 144/83  09/30/19 118/79   Wt Readings from Last 3 Encounters:  09/29/20 (!) 303 lb 6.4 oz (137.6 kg)  10/25/19 294 lb (133.4 kg)  10/18/19 294 lb (133.4 kg)      Physical Exam Vitals reviewed.  Constitutional:      Appearance: He is well-developed.     Comments: Obese WM NAD.  HENT:     Head: Normocephalic and atraumatic.     Nose: Nose normal.  Eyes:     General: No scleral icterus.    Conjunctiva/sclera: Conjunctivae normal.  Neck:     Thyroid: No thyromegaly.  Cardiovascular:     Rate and Rhythm: Normal rate and regular rhythm.     Heart sounds: Normal heart sounds.  Pulmonary:     Effort: Pulmonary effort is normal.  Breath sounds: Normal breath sounds.  Abdominal:     Palpations: Abdomen is soft.     Comments: Small umbilical hernia.  Musculoskeletal:        General: Normal range of motion.  Skin:    General: Skin is warm and dry.  Neurological:     General: No focal deficit present.     Mental Status: He is alert and oriented to person, place, and time.  Psychiatric:        Mood and Affect: Mood normal.        Behavior: Behavior normal.        Thought Content: Thought content normal.        Judgment: Judgment normal.     Most recent functional status assessment: In your present state of health, do you have any difficulty performing the following activities: 09/29/2020  Hearing? Y  Vision? N  Difficulty concentrating or making decisions? N   Walking or climbing stairs? N  Dressing or bathing? N  Doing errands, shopping? N  Some recent data might be hidden   Most recent fall risk assessment: Fall Risk  09/29/2020  Falls in the past year? 0  Number falls in past yr: 0  Injury with Fall? 0  Risk for fall due to : No Fall Risks  Follow up -    Most recent depression screenings: PHQ 2/9 Scores 09/29/2020 05/29/2019  PHQ - 2 Score 0 0  PHQ- 9 Score 2 2   Most recent cognitive screening: No flowsheet data found. Most recent Audit-C alcohol use screening Alcohol Use Disorder Test (AUDIT) 09/29/2020  1. How often do you have a drink containing alcohol? 0  2. How many drinks containing alcohol do you have on a typical day when you are drinking? 0  3. How often do you have six or more drinks on one occasion? 0  AUDIT-C Score 0   A score of 3 or more in women, and 4 or more in men indicates increased risk for alcohol abuse, EXCEPT if all of the points are from question 1   No results found for any visits on 09/29/20.  Assessment & Plan     Annual wellness visit done today including the all of the following: Reviewed patient's Family Medical History Reviewed and updated list of patient's medical providers Assessment of cognitive impairment was done Assessed patient's functional ability Established a written schedule for health screening [place] Completed and Reviewed  Exercise Activities and Dietary recommendations  Goals   None     Immunization History  Administered Date(s) Administered   Influenza,inj,Quad PF,6+ Mos 11/07/2014, 11/07/2015, 01/02/2017, 10/07/2017, 10/16/2018   Rabies, IM 11/18/2016, 11/21/2016, 11/25/2016, 12/02/2016   Tdap 12/06/2011    Health Maintenance  Topic Date Due   COVID-19 Vaccine (1) Never done   Zoster Vaccines- Shingrix (1 of 2) Never done   PNA vac Low Risk Adult (1 of 2 - PCV13) Never done   INFLUENZA VACCINE  08/31/2020   TETANUS/TDAP  12/05/2021    COLONOSCOPY (Pts 45-79yr Insurance coverage will need to be confirmed)  04/08/2024   Hepatitis C Screening  Completed   HPV VACCINES  Aged Out   Discussed health benefits of physical activity, and encouraged him to engage in regular exercise appropriate for his age and condition.    1. Encounter for Medicare annual wellness exam   2. Annual physical exam  - POCT Urinalysis Dipstick  3. Mixed hyperlipidemia  - Lipid panel - TSH - Comprehensive  Metabolic Panel (CMET)  4. PAC (premature atrial contraction)  - TSH  5. Class 2 severe obesity due to excess calories with serious comorbidity and body mass index (BMI) of 39.0 to 39.9 in adult (HCC)  - TSH - Comprehensive Metabolic Panel (CMET)  6. Obstructive sleep apnea syndrome   7. 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 Chronic polyneuropathy  - CBC w/Diff/Platelet  9. Screening for prostate cancer  - PSA  10. Hearing loss, unspecified hearing loss type, unspecified laterality  - Ambulatory referral to ENT  11. Neuropathy Increase gabapentin dose by doubling bedtime dose - gabapentin (NEURONTIN) 300 MG capsule; Take 2 capsules (600 mg total) by mouth at bedtime.  Dispense: 180 capsule; Refill: 3     Return in about 6 months (around 03/30/2021).     I, Wilhemena Durie, MD, have reviewed all documentation for this visit. The documentation on 10/03/20 for the exam, diagnosis, procedures, and orders are all accurate and complete.    Lorianna Spadaccini Cranford Mon, MD  [facility] [phone] (phone) [phone] (fax)  [place]

## 2020-09-29 NOTE — Patient Instructions (Signed)
Preventive Care 65 Years and Older, Male Preventive care refers to lifestyle choices and visits with your health care provider that can promote health and wellness. This includes: A yearly physical exam. This is also called an annual wellness visit. Regular dental and eye exams. Immunizations. Screening for certain conditions. Healthy lifestyle choices, such as: Eating a healthy diet. Getting regular exercise. Not using drugs or products that contain nicotine and tobacco. Limiting alcohol use. What can I expect for my preventive care visit? Physical exam Your health care provider will check your: Height and weight. These may be used to calculate your BMI (body mass index). BMI is a measurement that tells if you are at a healthy weight. Heart rate and blood pressure. Body temperature. Skin for abnormal spots. Counseling Your health care provider may ask you questions about your: Past medical problems. Family's medical history. Alcohol, tobacco, and drug use. Emotional well-being. Home life and relationship well-being. Sexual activity. Diet, exercise, and sleep habits. History of falls. Memory and ability to understand (cognition). Work and work environment. Access to firearms. What immunizations do I need?  Vaccines are usually given at various ages, according to a schedule. Your health care provider will recommend vaccines for you based on your age, medicalhistory, and lifestyle or other factors, such as travel or where you work. What tests do I need? Blood tests Lipid and cholesterol levels. These may be checked every 5 years, or more often depending on your overall health. Hepatitis C test. Hepatitis B test. Screening Lung cancer screening. You may have this screening every year starting at age 55 if you have a 30-pack-year history of smoking and currently smoke or have quit within the past 15 years. Colorectal cancer screening. All adults should have this screening  starting at age 50 and continuing until age 75. Your health care provider may recommend screening at age 45 if you are at increased risk. You will have tests every 1-10 years, depending on your results and the type of screening test. Prostate cancer screening. Recommendations will vary depending on your family history and other risks. Genital exam to check for testicular cancer or hernias. Diabetes screening. This is done by checking your blood sugar (glucose) after you have not eaten for a while (fasting). You may have this done every 1-3 years. Abdominal aortic aneurysm (AAA) screening. You may need this if you are a current or former smoker. STD (sexually transmitted disease) testing, if you are at risk. Follow these instructions at home: Eating and drinking  Eat a diet that includes fresh fruits and vegetables, whole grains, lean protein, and low-fat dairy products. Limit your intake of foods with high amounts of sugar, saturated fats, and salt. Take vitamin and mineral supplements as recommended by your health care provider. Do not drink alcohol if your health care provider tells you not to drink. If you drink alcohol: Limit how much you have to 0-2 drinks a day. Be aware of how much alcohol is in your drink. In the [place] one drink equals one 12 oz bottle of beer (355 mL), one 5 oz glass of wine (148 mL), or one 1 oz glass of hard liquor (44 mL).  Lifestyle Take daily care of your teeth and gums. Brush your teeth every morning and night with fluoride toothpaste. Floss one time each day. Stay active. Exercise for at least 30 minutes 5 or more days each week. Do not use any products that contain nicotine or tobacco, such as cigarettes, e-cigarettes, and chewing tobacco.   If you need help quitting, ask your health care provider. Do not use drugs. If you are sexually active, practice safe sex. Use a condom or other form of protection to prevent STIs (sexually transmitted infections). Talk  with your health care provider about taking a low-dose aspirin or statin. Find healthy ways to cope with stress, such as: Meditation, yoga, or listening to music. Journaling. Talking to a trusted person. Spending time with friends and family. Safety Always wear your seat belt while driving or riding in a vehicle. Do not drive: If you have been drinking alcohol. Do not ride with someone who has been drinking. When you are tired or distracted. While texting. Wear a helmet and other protective equipment during sports activities. If you have firearms in your house, make sure you follow all gun safety procedures. What's next? Visit your health care provider once a year for an annual wellness visit. Ask your health care provider how often you should have your eyes and teeth checked. Stay up to date on all vaccines. This information is not intended to replace advice given to you by your health care provider. Make sure you discuss any questions you have with your healthcare provider. Document Revised: 10/16/2018 Document Reviewed: 01/11/2018 Elsevier Patient Education  2022 [employer]  

## 2020-09-30 LAB — CBC WITH DIFFERENTIAL/PLATELET
Basophils Absolute: 0 10*3/uL (ref 0.0–0.2)
Basos: 1 %
EOS (ABSOLUTE): 0.3 10*3/uL (ref 0.0–0.4)
Eos: 6 %
Hematocrit: 42.6 % (ref 37.5–51.0)
Hemoglobin: 14.3 g/dL (ref 13.0–17.7)
Immature Grans (Abs): 0 10*3/uL (ref 0.0–0.1)
Immature Granulocytes: 1 %
Lymphocytes Absolute: 2.1 10*3/uL (ref 0.7–3.1)
Lymphs: 40 %
MCH: 29.3 pg (ref 26.6–33.0)
MCHC: 33.6 g/dL (ref 31.5–35.7)
MCV: 87 fL (ref 79–97)
Monocytes Absolute: 0.6 10*3/uL (ref 0.1–0.9)
Monocytes: 12 %
Neutrophils Absolute: 2.1 10*3/uL (ref 1.4–7.0)
Neutrophils: 40 %
Platelets: 234 10*3/uL (ref 150–450)
RBC: 4.88 x10E6/uL (ref 4.14–5.80)
RDW: 12.1 % (ref 11.6–15.4)
WBC: 5.1 10*3/uL (ref 3.4–10.8)

## 2020-09-30 LAB — COMPREHENSIVE METABOLIC PANEL
ALT: 22 IU/L (ref 0–44)
AST: 10 IU/L (ref 0–40)
Albumin/Globulin Ratio: 2.1 (ref 1.2–2.2)
Albumin: 4.5 g/dL (ref 3.8–4.8)
Alkaline Phosphatase: 85 IU/L (ref 44–121)
BUN/Creatinine Ratio: 14 (ref 10–24)
BUN: 16 mg/dL (ref 8–27)
Bilirubin Total: 0.5 mg/dL (ref 0.0–1.2)
CO2: 21 mmol/L (ref 20–29)
Calcium: 9.4 mg/dL (ref 8.6–10.2)
Chloride: 105 mmol/L (ref 96–106)
Creatinine, Ser: 1.14 mg/dL (ref 0.76–1.27)
Globulin, Total: 2.1 g/dL (ref 1.5–4.5)
Glucose: 93 mg/dL (ref 65–99)
Potassium: 4.8 mmol/L (ref 3.5–5.2)
Sodium: 141 mmol/L (ref 134–144)
Total Protein: 6.6 g/dL (ref 6.0–8.5)
eGFR: 71 mL/min/{1.73_m2} (ref >59)

## 2020-09-30 LAB — LIPID PANEL
Chol/HDL Ratio: 5.1 ratio — ABNORMAL HIGH (ref 0.0–5.0)
Cholesterol, Total: 195 mg/dL (ref 100–199)
HDL: 38 mg/dL — ABNORMAL LOW (ref >39)
LDL Chol Calc (NIH): 129 mg/dL — ABNORMAL HIGH (ref 0–99)
Triglycerides: 156 mg/dL — ABNORMAL HIGH (ref 0–149)
VLDL Cholesterol Cal: 28 mg/dL (ref 5–40)

## 2020-09-30 LAB — TSH: TSH: 2.88 u[IU]/mL (ref 0.450–4.500)

## 2020-11-16 DIAGNOSIS — H903 Sensorineural hearing loss, bilateral: Secondary | ICD-10-CM | POA: Diagnosis not present

## 2020-12-07 DIAGNOSIS — H905 Unspecified sensorineural hearing loss: Secondary | ICD-10-CM | POA: Diagnosis not present

## 2020-12-28 LAB — HM DIABETES FOOT EXAM: HM Diabetic Foot Exam: NORMAL

## 2020-12-29 LAB — HM HEPATITIS C SCREENING LAB: HM Hepatitis Screen: NEGATIVE

## 2020-12-31 ENCOUNTER — Encounter: Payer: Medicare Other | Admitting: Family Medicine

## 2021-01-11 ENCOUNTER — Encounter: Payer: Self-pay | Admitting: *Deleted

## 2021-02-22 ENCOUNTER — Emergency Department: Payer: Medicare Other

## 2021-02-22 ENCOUNTER — Other Ambulatory Visit: Payer: Self-pay

## 2021-02-22 ENCOUNTER — Other Ambulatory Visit
Admission: RE | Admit: 2021-02-22 | Discharge: 2021-02-22 | Disposition: A | Discharge status: Home / Self Care | Payer: Medicare Other | Source: Home / Self Care | Attending: Optometry | Admitting: Optometry

## 2021-02-22 ENCOUNTER — Telehealth: Payer: Self-pay

## 2021-02-22 ENCOUNTER — Emergency Department
Admission: EM | Admit: 2021-02-22 | Discharge: 2021-02-22 | Disposition: A | Discharge status: Home / Self Care | Payer: Medicare Other | Attending: Emergency Medicine | Admitting: Emergency Medicine

## 2021-02-22 DIAGNOSIS — H5340 Unspecified visual field defects: Secondary | ICD-10-CM | POA: Diagnosis present

## 2021-02-22 DIAGNOSIS — H47019 Ischemic optic neuropathy, unspecified eye: Secondary | ICD-10-CM | POA: Insufficient documentation

## 2021-02-22 DIAGNOSIS — I639 Cerebral infarction, unspecified: Secondary | ICD-10-CM | POA: Diagnosis not present

## 2021-02-22 DIAGNOSIS — I6522 Occlusion and stenosis of left carotid artery: Secondary | ICD-10-CM | POA: Diagnosis not present

## 2021-02-22 DIAGNOSIS — H538 Other visual disturbances: Secondary | ICD-10-CM | POA: Diagnosis not present

## 2021-02-22 DIAGNOSIS — H2513 Age-related nuclear cataract, bilateral: Secondary | ICD-10-CM | POA: Diagnosis not present

## 2021-02-22 DIAGNOSIS — H539 Unspecified visual disturbance: Secondary | ICD-10-CM | POA: Diagnosis not present

## 2021-02-22 DIAGNOSIS — R9431 Abnormal electrocardiogram [ECG] [EKG]: Secondary | ICD-10-CM | POA: Diagnosis not present

## 2021-02-22 DIAGNOSIS — H47012 Ischemic optic neuropathy, left eye: Secondary | ICD-10-CM | POA: Diagnosis not present

## 2021-02-22 LAB — CBC
HCT: 44.9 % (ref 39.0–52.0)
Hemoglobin: 14.9 g/dL (ref 13.0–17.0)
MCH: 29.4 pg (ref 26.0–34.0)
MCHC: 33.2 g/dL (ref 30.0–36.0)
MCV: 88.7 fL (ref 80.0–100.0)
Platelets: 227 10*3/uL (ref 150–400)
RBC: 5.06 MIL/uL (ref 4.22–5.81)
RDW: 12.1 % (ref 11.5–15.5)
WBC: 6.3 10*3/uL (ref 4.0–10.5)
nRBC: 0 % (ref 0.0–0.2)

## 2021-02-22 LAB — APTT: aPTT: 30 seconds (ref 24–36)

## 2021-02-22 LAB — COMPREHENSIVE METABOLIC PANEL
ALT: 27 U/L (ref 0–44)
AST: 17 U/L (ref 15–41)
Albumin: 4.2 g/dL (ref 3.5–5.0)
Alkaline Phosphatase: 84 U/L (ref 38–126)
Anion gap: 9 (ref 5–15)
BUN: 18 mg/dL (ref 8–23)
CO2: 26 mmol/L (ref 22–32)
Calcium: 9.4 mg/dL (ref 8.9–10.3)
Chloride: 104 mmol/L (ref 98–111)
Creatinine, Ser: 1.15 mg/dL (ref 0.61–1.24)
GFR, Estimated: >60 mL/min (ref >60)
Glucose, Bld: 144 mg/dL — ABNORMAL HIGH (ref 70–99)
Potassium: 3.9 mmol/L (ref 3.5–5.1)
Sodium: 139 mmol/L (ref 135–145)
Total Bilirubin: 0.7 mg/dL (ref 0.3–1.2)
Total Protein: 7.4 g/dL (ref 6.5–8.1)

## 2021-02-22 LAB — CBC WITH DIFFERENTIAL/PLATELET
Abs Immature Granulocytes: 0.06 10*3/uL (ref 0.00–0.07)
Basophils Absolute: 0 10*3/uL (ref 0.0–0.1)
Basophils Relative: 1 %
Eosinophils Absolute: 0.3 10*3/uL (ref 0.0–0.5)
Eosinophils Relative: 4 %
HCT: 45.9 % (ref 39.0–52.0)
Hemoglobin: 15.6 g/dL (ref 13.0–17.0)
Immature Granulocytes: 1 %
Lymphocytes Relative: 32 %
Lymphs Abs: 2.2 10*3/uL (ref 0.7–4.0)
MCH: 29.9 pg (ref 26.0–34.0)
MCHC: 34 g/dL (ref 30.0–36.0)
MCV: 87.9 fL (ref 80.0–100.0)
Monocytes Absolute: 0.8 10*3/uL (ref 0.1–1.0)
Monocytes Relative: 11 %
Neutro Abs: 3.7 10*3/uL (ref 1.7–7.7)
Neutrophils Relative %: 51 %
Platelets: 219 10*3/uL (ref 150–400)
RBC: 5.22 MIL/uL (ref 4.22–5.81)
RDW: 12 % (ref 11.5–15.5)
WBC: 7 10*3/uL (ref 4.0–10.5)
nRBC: 0 % (ref 0.0–0.2)

## 2021-02-22 LAB — DIFFERENTIAL
Abs Immature Granulocytes: 0.04 10*3/uL (ref 0.00–0.07)
Basophils Absolute: 0 10*3/uL (ref 0.0–0.1)
Basophils Relative: 1 %
Eosinophils Absolute: 0.2 10*3/uL (ref 0.0–0.5)
Eosinophils Relative: 3 %
Immature Granulocytes: 1 %
Lymphocytes Relative: 31 %
Lymphs Abs: 1.9 10*3/uL (ref 0.7–4.0)
Monocytes Absolute: 0.4 10*3/uL (ref 0.1–1.0)
Monocytes Relative: 7 %
Neutro Abs: 3.6 10*3/uL (ref 1.7–7.7)
Neutrophils Relative %: 57 %

## 2021-02-22 LAB — PROTIME-INR
INR: 1 (ref 0.8–1.2)
Prothrombin Time: 12.8 seconds (ref 11.4–15.2)

## 2021-02-22 LAB — C-REACTIVE PROTEIN: CRP: 0.7 mg/dL (ref <1.0)

## 2021-02-22 LAB — SEDIMENTATION RATE: Sed Rate: 8 mm/hr (ref 0–20)

## 2021-02-22 MED ORDER — LORAZEPAM 2 MG/ML IJ SOLN
0.5000 mg | Freq: Once | INTRAMUSCULAR | Status: AC
  Administered 2021-02-22: 0.5 mg via INTRAVENOUS
  Filled 2021-02-22: qty 1

## 2021-02-22 MED ORDER — IOHEXOL 350 MG/ML SOLN
75.0000 mL | Freq: Once | INTRAVENOUS | Status: AC | PRN
  Administered 2021-02-22: 75 mL via INTRAVENOUS

## 2021-02-22 MED ORDER — SODIUM CHLORIDE 0.9% FLUSH
3.0000 mL | Freq: Once | INTRAVENOUS | Status: AC
  Administered 2021-02-22: 3 mL via INTRAVENOUS

## 2021-02-22 MED ORDER — GADOBUTROL 1 MMOL/ML IV SOLN
10.0000 mL | Freq: Once | INTRAVENOUS | Status: AC | PRN
  Administered 2021-02-22: 10 mL via INTRAVENOUS

## 2021-02-22 NOTE — Telephone Encounter (Signed)
1136- pt had called in to let us know he is on the way with orders from Dr. Ellin Mayhew. Pt stated he was 15 min away from practice. Advised pt since he is needing Code Stroke work up and imaging done would be best to go to ED to be seen. Advised pt he can go to lab first to see if they can advise him if labs can be done or going to ED to have everything done is better. Pt verbalized understanding and stated he will just head there now.   1130 - called and spoke with FC to let her know that pt was on way with stat work up orders. She states that office will close at 1145 and reopen at 1300. Explained some of the imaging requests from Dr. Ellin Mayhew and if pt going to ED would be best. She agreed that d/t stat labs and code stroke work up, pt going to lab at [place] would be better.    1127- Dr. Ellin Mayhew, [facility], called to let office know that pt stat work up needed r/t  L eye showed mini stroke, nerve swollen, acute isometric optic neuropathy. Pt has prescription pad with all the tests and labs needing done and is on the way to the practice.

## 2021-02-22 NOTE — Telephone Encounter (Signed)
Noted. Agree with ED disposition

## 2021-02-22 NOTE — ED Triage Notes (Signed)
Pt to ED for blurred vision in left eye for 2.5 weeks. Reports minor balance problems, no falls. Went to eye doctor today who referred him here. Had blood work done today  Ambulatory steady gait

## 2021-02-22 NOTE — Telephone Encounter (Signed)
FYI

## 2021-02-22 NOTE — ED Provider Triage Note (Signed)
Emergency Medicine Provider Triage Evaluation Note  Terry Liu , a 66 y.o. male  was evaluated in triage.  Pt complains of left  eye field deficit. He presents with written orders from his ophthalmologist for evaluation of possible stroke with vision deficit. He had a routine eye exam just prior to arrival. He notes 2.5 weeks of left eye vision change. He denies facial paralysis, weakness, or slurred speech. He apparently has orders for bloodwork, drawn just prior to arrival. He is on Eliquis for history of DVT.  Review of Systems  Positive: Left eye vision deficit x 2.5 weeks Negative: Weakness, paralysis, headache  Physical Exam  Ht 6' (1.829 m)    Wt 136.1 kg    BMI 40.69 kg/m  Gen:   Awake, no distress   Resp:  Normal effort  MSK:   Moves extremities without difficulty  Other:  CVS: RRR  Medical Decision Making  Medically screening exam initiated at 2:02 PM.  Appropriate orders placed.  Nanine Means was informed that the remainder of the evaluation will be completed by another provider, this initial triage assessment does not replace that evaluation, and the importance of remaining in the ED until their evaluation is complete.  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    Melvenia Needles, PA-C 02/22/21 1416

## 2021-02-22 NOTE — ED Provider Notes (Signed)
[facility] Provider Note    Event Date/Time   First MD Initiated Contact with Patient 02/22/21 1547     (approximate)   History   Blurred Vision   HPI  Terry Liu is a 66 y.o. male who presents with 2 weeks of left eye blurry vision.  Patient saw optometrist today who determined that he has a superior visual field defect and evidence of optic nerve injury and is concerned about a stroke and was referred to the emergency department immediately.  Patient has no other symptoms.  Only left eye is affected.     Physical Exam   Triage Vital Signs: ED Triage Vitals  Enc Vitals Group     BP 02/22/21 1413 (!) 150/98     Pulse Rate 02/22/21 1413 87     Resp 02/22/21 1413 18     Temp 02/22/21 1413 98.4 F (36.9 C)     Temp Source 02/22/21 1413 Oral     SpO2 02/22/21 1413 96 %     Weight 02/22/21 1305 136.1 kg (300 lb)     Height 02/22/21 1305 1.829 m (6')     Head Circumference --      Peak Flow --      Pain Score 02/22/21 1305 0     Pain Loc --      Pain Edu? --      Excl. in [place]? --     Most recent vital signs: Vitals:   02/22/21 1942 02/22/21 2200  BP: (!) 150/95 112/84  Pulse: 70 70  Resp: 16 16  Temp:    SpO2: 98% 98%     General: Awake, no distress.  CV:  Good peripheral perfusion.  Resp:  Normal effort.  Abd:  No distention.  Other:  Neuro: Pupils equal round and reactive, EOMI, visual field defect as described above, cranial nerves otherwise unremarkable, normal strength, ambulates well without difficulty   ED Results / Procedures / Treatments   Labs (all labs ordered are listed, but only abnormal results are displayed) Labs Reviewed  COMPREHENSIVE METABOLIC PANEL - Abnormal; Notable for the following components:      Result Value   Glucose, Bld 144 (*)    All other components within normal limits  PROTIME-INR  APTT  CBC  DIFFERENTIAL  CBG MONITORING, ED     EKG  ED ECG REPORT I, Lavonia Drafts, the attending  physician, personally viewed and interpreted this ECG.  Date: 02/22/2021  Rhythm: normal sinus rhythm QRS Axis: normal Intervals: normal ST/T Wave abnormalities: Nonspecific changes   narrative Interpretation: no evidence of acute ischemia    RADIOLOGY  CT head reviewed by me, no acute abnormality   PROCEDURES:  Critical Care performed:   Procedures   MEDICATIONS ORDERED IN ED: Medications  sodium chloride flush (NS) 0.9 % injection 3 mL (3 mLs Intravenous Given 02/22/21 1944)  LORazepam (ATIVAN) injection 0.5 mg (0.5 mg Intravenous Given 02/22/21 1944)  iohexol (OMNIPAQUE) 350 MG/ML injection 75 mL (75 mLs Intravenous Contrast Given 02/22/21 1706)  gadobutrol (GADAVIST) 1 MMOL/ML injection 10 mL (10 mLs Intravenous Contrast Given 02/22/21 2059)     IMPRESSION / MDM / ASSESSMENT AND PLAN / ED COURSE  I reviewed the triage vital signs and the nursing notes.  Patient presents with visual field defects as noted above, times greater than 2 weeks.  CT head is overall reassuring   CBC and BMP are unremarkable  Plan is to obtain MRI of the brain  and orbits with CTA of the head and neck, discussed with Dr. Cheral Marker of neurology who agrees with imaging although we have no documentation from outpatient provider  ----------------------------------------- 10:30 PM on 02/22/2021 ----------------------------------------- Patient's MRI of the brain and orbits are reassuring, CTA of the head and neck are reassuring.  Patient is appropriate for discharge with outpatient follow-up with ophthalmology for further evaluation work-up       FINAL CLINICAL IMPRESSION(S) / ED DIAGNOSES   Final diagnoses:  Visual field defect     Rx / DC Orders   ED Discharge Orders     None        Note:  This document was prepared using Dragon voice recognition software and may include unintentional dictation errors.   Lavonia Drafts, MD 02/22/21 2230

## 2021-02-26 ENCOUNTER — Telehealth: Payer: Self-pay | Admitting: Family Medicine

## 2021-02-26 NOTE — Telephone Encounter (Signed)
Please Review

## 2021-02-26 NOTE — Telephone Encounter (Signed)
Patient was recently in the hospital  02/22/21 and would like for someone to contact him to help explain his visit and if any further action needs to be taken

## 2021-03-09 DIAGNOSIS — D485 Neoplasm of uncertain behavior of skin: Secondary | ICD-10-CM | POA: Diagnosis not present

## 2021-03-09 DIAGNOSIS — C44622 Squamous cell carcinoma of skin of right upper limb, including shoulder: Secondary | ICD-10-CM | POA: Diagnosis not present

## 2021-03-15 DIAGNOSIS — G4733 Obstructive sleep apnea (adult) (pediatric): Secondary | ICD-10-CM | POA: Insufficient documentation

## 2021-03-15 DIAGNOSIS — I82532 Chronic embolism and thrombosis of left popliteal vein: Secondary | ICD-10-CM | POA: Diagnosis not present

## 2021-03-15 DIAGNOSIS — I6523 Occlusion and stenosis of bilateral carotid arteries: Secondary | ICD-10-CM | POA: Insufficient documentation

## 2021-03-15 DIAGNOSIS — E782 Mixed hyperlipidemia: Secondary | ICD-10-CM | POA: Diagnosis not present

## 2021-03-15 DIAGNOSIS — G453 Amaurosis fugax: Secondary | ICD-10-CM | POA: Insufficient documentation

## 2021-03-15 DIAGNOSIS — R0602 Shortness of breath: Secondary | ICD-10-CM | POA: Insufficient documentation

## 2021-03-15 DIAGNOSIS — R9431 Abnormal electrocardiogram [ECG] [EKG]: Secondary | ICD-10-CM | POA: Insufficient documentation

## 2021-03-15 DIAGNOSIS — R002 Palpitations: Secondary | ICD-10-CM | POA: Diagnosis not present

## 2021-03-22 DIAGNOSIS — G453 Amaurosis fugax: Secondary | ICD-10-CM | POA: Diagnosis not present

## 2021-03-22 DIAGNOSIS — R0602 Shortness of breath: Secondary | ICD-10-CM | POA: Diagnosis not present

## 2021-03-22 DIAGNOSIS — R9431 Abnormal electrocardiogram [ECG] [EKG]: Secondary | ICD-10-CM | POA: Diagnosis not present

## 2021-03-23 DIAGNOSIS — C44622 Squamous cell carcinoma of skin of right upper limb, including shoulder: Secondary | ICD-10-CM | POA: Diagnosis not present

## 2021-03-23 DIAGNOSIS — D0461 Carcinoma in situ of skin of right upper limb, including shoulder: Secondary | ICD-10-CM | POA: Diagnosis not present

## 2021-03-24 DIAGNOSIS — R9431 Abnormal electrocardiogram [ECG] [EKG]: Secondary | ICD-10-CM | POA: Diagnosis not present

## 2021-03-25 DIAGNOSIS — H47012 Ischemic optic neuropathy, left eye: Secondary | ICD-10-CM | POA: Diagnosis not present

## 2021-03-25 DIAGNOSIS — H2513 Age-related nuclear cataract, bilateral: Secondary | ICD-10-CM | POA: Diagnosis not present

## 2021-03-31 ENCOUNTER — Ambulatory Visit: Payer: Medicare Other | Admitting: Family Medicine

## 2021-03-31 DIAGNOSIS — R002 Palpitations: Secondary | ICD-10-CM | POA: Diagnosis not present

## 2021-03-31 DIAGNOSIS — R9431 Abnormal electrocardiogram [ECG] [EKG]: Secondary | ICD-10-CM | POA: Diagnosis not present

## 2021-04-07 DIAGNOSIS — Z09 Encounter for follow-up examination after completed treatment for conditions other than malignant neoplasm: Secondary | ICD-10-CM | POA: Diagnosis not present

## 2021-04-07 DIAGNOSIS — L905 Scar conditions and fibrosis of skin: Secondary | ICD-10-CM | POA: Diagnosis not present

## 2021-04-07 DIAGNOSIS — L821 Other seborrheic keratosis: Secondary | ICD-10-CM | POA: Diagnosis not present

## 2021-04-07 DIAGNOSIS — Z86018 Personal history of other benign neoplasm: Secondary | ICD-10-CM | POA: Diagnosis not present

## 2021-04-07 DIAGNOSIS — D485 Neoplasm of uncertain behavior of skin: Secondary | ICD-10-CM | POA: Diagnosis not present

## 2021-04-07 DIAGNOSIS — L578 Other skin changes due to chronic exposure to nonionizing radiation: Secondary | ICD-10-CM | POA: Diagnosis not present

## 2021-04-07 DIAGNOSIS — D225 Melanocytic nevi of trunk: Secondary | ICD-10-CM | POA: Diagnosis not present

## 2021-04-07 DIAGNOSIS — C44519 Basal cell carcinoma of skin of other part of trunk: Secondary | ICD-10-CM | POA: Diagnosis not present

## 2021-04-07 DIAGNOSIS — Z85828 Personal history of other malignant neoplasm of skin: Secondary | ICD-10-CM | POA: Diagnosis not present

## 2021-04-07 DIAGNOSIS — Z859 Personal history of malignant neoplasm, unspecified: Secondary | ICD-10-CM | POA: Diagnosis not present

## 2021-04-08 DIAGNOSIS — I6523 Occlusion and stenosis of bilateral carotid arteries: Secondary | ICD-10-CM | POA: Diagnosis not present

## 2021-04-08 DIAGNOSIS — E782 Mixed hyperlipidemia: Secondary | ICD-10-CM | POA: Diagnosis not present

## 2021-04-08 DIAGNOSIS — R002 Palpitations: Secondary | ICD-10-CM | POA: Diagnosis not present

## 2021-04-08 DIAGNOSIS — R0602 Shortness of breath: Secondary | ICD-10-CM | POA: Diagnosis not present

## 2021-04-08 DIAGNOSIS — G4733 Obstructive sleep apnea (adult) (pediatric): Secondary | ICD-10-CM | POA: Diagnosis not present

## 2021-04-14 ENCOUNTER — Ambulatory Visit: Payer: Medicare Other | Admitting: Family Medicine

## 2021-05-11 DIAGNOSIS — L988 Other specified disorders of the skin and subcutaneous tissue: Secondary | ICD-10-CM | POA: Diagnosis not present

## 2021-05-11 DIAGNOSIS — C44519 Basal cell carcinoma of skin of other part of trunk: Secondary | ICD-10-CM | POA: Diagnosis not present

## 2021-06-21 DIAGNOSIS — H2513 Age-related nuclear cataract, bilateral: Secondary | ICD-10-CM | POA: Diagnosis not present

## 2021-06-21 DIAGNOSIS — H47012 Ischemic optic neuropathy, left eye: Secondary | ICD-10-CM | POA: Diagnosis not present

## 2021-06-23 ENCOUNTER — Other Ambulatory Visit: Payer: Self-pay | Admitting: Family Medicine

## 2021-09-22 ENCOUNTER — Telehealth: Payer: Self-pay | Admitting: Family Medicine

## 2021-09-22 NOTE — Telephone Encounter (Signed)
Copied from [place] [id number]. Topic: Medicare AWV >> Sep 22, 2021  1:53 PM Jae Dire wrote: Reason for CRM:  Left message for patient to call back and schedule Medicare Annual Wellness Visit (AWV) in office.   If unable to come into the office for AWV,  please offer to do virtually or by telephone.  Last AWV:  09/29/2020  Please schedule at anytime with [facility] Health Advisor.  30 minute appointment for Virtual or phone 45 minute appointment for in office or Initial virtual/phone  Any questions, please contact me at [phone]

## 2021-10-14 ENCOUNTER — Other Ambulatory Visit: Payer: Self-pay | Admitting: Family Medicine

## 2021-10-14 DIAGNOSIS — G629 Polyneuropathy, unspecified: Secondary | ICD-10-CM

## 2021-10-20 ENCOUNTER — Ambulatory Visit (INDEPENDENT_AMBULATORY_CARE_PROVIDER_SITE_OTHER): Payer: Medicare Other

## 2021-10-20 VITALS — BP 130/76 | Ht 72.0 in | Wt 302.5 lb

## 2021-10-20 DIAGNOSIS — Z0000 Encounter for general adult medical examination without abnormal findings: Secondary | ICD-10-CM | POA: Diagnosis not present

## 2021-10-20 NOTE — Progress Notes (Signed)
Subjective:   Terry Liu is a 66 y.o. male who presents for Medicare Annual/Subsequent preventive examination.  Review of Systems     Cardiac Risk Factors include: advanced age (>36mn, >>63women);male gender;hypertension;dyslipidemia     Objective:    Today's Vitals   10/20/21 0810  BP: 130/76  Weight: (!) 302 lb 8 oz (137.2 kg)  Height: 6' (1.829 m)   Body mass index is 41.03 kg/m.     10/20/2021    8:25 AM 10/25/2019   12:33 PM 10/18/2019   10:52 AM 04/09/2019    7:10 AM 12/31/2015    9:57 AM 02/04/2015   11:10 AM 09/19/2014   11:50 AM  Advanced Directives  Does Patient Have a Medical Advance Directive? No No No No No No No  Would patient like information on creating a medical advance directive? No - Patient declined No - Patient declined No - Patient declined   No - patient declined information     Current Medications (verified) Outpatient Encounter Medications as of 10/20/2021  Medication Sig   ELIQUIS 5 MG TABS tablet TAKE 1 TABLET BY MOUTH TWICE DAILY   gabapentin (NEURONTIN) 300 MG capsule TAKE 2 CAPSULES(600 MG) BY MOUTH AT BEDTIME   oxymetazoline (AFRIN) 0.05 % nasal spray Place 1 spray into both nostrils 2 (two) times daily as needed for congestion.   [DISCONTINUED] gabapentin (NEURONTIN) 300 MG capsule Take by mouth.   aspirin EC 81 MG tablet Take by mouth. (Patient not taking: Reported on 10/20/2021)   atorvastatin (LIPITOR) 20 MG tablet Take by mouth. (Patient not taking: Reported on 10/20/2021)   metoprolol succinate (TOPROL-XL) 25 MG 24 hr tablet TAKE 1 TABLET(25 MG) BY MOUTH DAILY (Patient not taking: No sig reported)   [DISCONTINUED] acetaminophen (TYLENOL) 325 MG tablet Take 650 mg by mouth every 6 (six) hours as needed for moderate pain.   [DISCONTINUED] ELIQUIS 5 MG TABS tablet TAKE 1 TABLET BY MOUTH TWICE DAILY   No facility-administered encounter medications on file as of 10/20/2021.    Allergies (verified) Patient has no known allergies.    History: Past Medical History:  Diagnosis Date   Arthritis    Osteoarthritis   DVT of leg (deep venous thrombosis) ([facility] 2013   LLE/Dr [place] [place]  GERD (gastroesophageal reflux disease)    History of blood clots 2014   Hyperlipidemia    Sleep apnea    has cpap   Past Surgical History:  Procedure Laterality Date   COLONOSCOPY  2010   COLONOSCOPY WITH PROPOFOL N/A 04/09/2019   Procedure: COLONOSCOPY WITH PROPOFOL;  Surgeon: WLucilla Lame MD;  Location: [facility] ENDOSCOPY;  Service: Endoscopy;  Laterality: N/A;   INSERTION OF MESH N/A 10/25/2019   Procedure: INSERTION OF MESH;  Surgeon: BRobert Bellow MD;  Location: [facility] ORS;  Service: General;  Laterality: N/A;   TONSILLECTOMY     UMBILICAL HERNIA REPAIR N/A 10/25/2019   Procedure: HERNIA REPAIR UMBILICAL ADULT;  Surgeon: BRobert Bellow MD;  Location: [facility] ORS;  Service: General;  Laterality: N/A;   Family History  Problem Relation Age of Onset   Lung cancer Mother    Breast cancer Mother [phone]  Lung cancer Father    Diabetes Sister    Melanoma Sister        benign breast tumor   Social History   Socioeconomic History   Marital status: Married    Spouse name: Not on file   Number of children: Not on file   Years  of education: Not on file   Highest education level: Not on file  Occupational History   Not on file  Tobacco Use   Smoking status: Never   Smokeless tobacco: Never  Vaping Use   Vaping Use: Never used  Substance and Sexual Activity   Alcohol use: No   Drug use: No   Sexual activity: Not on file  Other Topics Concern   Not on file  Social History Narrative   Not on file   Social Determinants of Health   Financial Resource Strain: Low Risk  (10/20/2021)   Overall Financial Resource Strain (CARDIA)    Difficulty of Paying Living Expenses: Not hard at all  Food Insecurity: No Food Insecurity (10/20/2021)   Hunger Vital Sign    Worried About Running Out of Food in the Last Year: Never true    Ran Out  of Food in the Last Year: Never true  Transportation Needs: No Transportation Needs (10/20/2021)   PRAPARE - Hydrologist (Medical): No    Lack of Transportation (Non-Medical): No  Physical Activity: Insufficiently Active (10/20/2021)   Exercise Vital Sign    Days of Exercise per Week: 3 days    Minutes of Exercise per Session: 30 min  Stress: No Stress Concern Present (10/20/2021)   [place]    Feeling of Stress : Not at all  Social Connections: Moderately Integrated (10/20/2021)   Social Connection and Isolation Panel [NHANES]    Frequency of Communication with Friends and Family: More than three times a week    Frequency of Social Gatherings with Friends and Family: More than three times a week    Attends Religious Services: More than 4 times per year    Active Member of [employer] or Organizations: No    Attends Music therapist: Never    Marital Status: Married    Tobacco Counseling Counseling given: Not Answered   Clinical Intake:  Pre-visit preparation completed: Yes  Pain : No/denies pain     Nutritional Risks: None Diabetes: No  How often do you need to have someone help you when you read instructions, pamphlets, or other written materials from your doctor or pharmacy?: 1 - Never  Diabetic?no  Interpreter Needed?: No  Information entered by :: Kirke Shaggy, LPN   Activities of Daily Living    10/20/2021    8:27 AM 10/19/2021    9:11 AM  In your present state of health, do you have any difficulty performing the following activities:  Hearing? 0 0  Vision? 0 1  Difficulty concentrating or making decisions? 0 0  Walking or climbing stairs? 0 0  Dressing or bathing? 0 0  Doing errands, shopping? 0 0  Preparing Food and eating ? N N  Using the Toilet? N N  In the past six months, have you accidently leaked urine? N N  Do you have problems with loss of  bowel control? N N  Managing your Medications? N N  Managing your Finances? N N  Housekeeping or managing your Housekeeping? N N    Patient Care Team: [name], MD as PCP - General (Family Medicine) [name], MD (Family Medicine) Bary Castilla, Forest Gleason, MD (General Surgery) Minna Merritts, MD as Consulting Physician (Cardiology)  Indicate any recent Medical Services you may have received from other than [facility] providers in the past year (date may be approximate).  Assessment:   This is a routine wellness examination for [employer].  Hearing/Vision screen Hearing Screening - Comments:: Wears aids Vision Screening - Comments:: Wears glasses- Dr.Woodard  Dietary issues and exercise activities discussed: Current Exercise Habits: Home exercise routine, Type of exercise: walking, Time (Minutes): 30, Frequency (Times/Week): 3, Weekly Exercise (Minutes/Week): 90, Intensity: Mild   Goals Addressed             This Visit's Progress    DIET - EAT MORE FRUITS AND VEGETABLES         Depression Screen    10/20/2021    8:20 AM 09/29/2020    9:37 AM 05/29/2019    9:27 AM 04/12/2018    2:52 PM 03/16/2017   10:14 AM 12/31/2015   10:02 AM 12/23/2014   10:02 AM  PHQ 2/9 Scores  PHQ - 2 Score 0 0 0 0 0 0 0  PHQ- 9 Score 0 2 2 0       Fall Risk    10/20/2021    8:26 AM 10/19/2021    9:11 AM 09/29/2020    9:37 AM 05/29/2019    9:27 AM 04/12/2018    2:52 PM  Fall Risk   Falls in the past year? 0 1 0 0 0  Number falls in past yr: 0 0 0 0   Injury with Fall? 0 0 0 0   Risk for fall due to : No Fall Risks  No Fall Risks    Follow up Falls prevention discussed   Falls evaluation completed     FALL RISK PREVENTION PERTAINING TO THE HOME:  Any stairs in or around the home? No  If so, are there any without handrails? No  Home free of loose throw rugs in walkways, pet beds, electrical cords, etc? Yes  Adequate lighting in your home to reduce risk of falls? Yes    ASSISTIVE DEVICES UTILIZED TO PREVENT FALLS:  Life alert? No  Use of a cane, walker or w/c? No  Grab bars in the bathroom? No  Shower chair or bench in shower? No  Elevated toilet seat or a handicapped toilet? No    Cognitive Function:    07/27/2017   10:25 AM  MMSE - Mini Mental State Exam  Orientation to time 5  Orientation to Place 5  Registration 3  Attention/ Calculation 5  Recall 3  Language- name 2 objects 2  Language- repeat 1  Language- follow 3 step command 3  Language- read & follow direction 1  Write a sentence 1  Copy design 1  Total score 30        10/20/2021    8:28 AM  6CIT Screen  What Year? 0 points  What month? 0 points  What time? 0 points  Count back from 20 0 points  Months in reverse 0 points  Repeat phrase 0 points  Total Score 0 points    Immunizations Immunization History  Administered Date(s) Administered   Influenza,inj,Quad PF,6+ Mos 11/07/2014, 11/07/2015, 01/02/2017, 10/07/2017, 10/16/2018   Rabies, IM 11/18/2016, 11/21/2016, 11/25/2016, 12/02/2016   Tdap 12/06/2011    TDAP status: Up to date  Flu Vaccine status: Declined, Education has been provided regarding the importance of this vaccine but patient still declined. Advised may receive this vaccine at local pharmacy or Health Dept. Aware to provide a copy of the vaccination record if obtained from local pharmacy or Health Dept. Verbalized acceptance and understanding.  Pneumococcal vaccine status: Due, Education has been provided regarding the importance  of this vaccine. Advised may receive this vaccine at local pharmacy or Health Dept. Aware to provide a copy of the vaccination record if obtained from local pharmacy or Health Dept. Verbalized acceptance and understanding.  Covid-19 vaccine status: Completed vaccines- per pt, no dates available  Qualifies for Shingles Vaccine? No   Zostavax completed No   Shingrix Completed?: No.    Education has been provided regarding the  importance of this vaccine. Patient has been advised to call insurance company to determine out of pocket expense if they have not yet received this vaccine. Advised may also receive vaccine at local pharmacy or Health Dept. Verbalized acceptance and understanding.  Screening Tests Health Maintenance  Topic Date Due   COVID-19 Vaccine (1) Never done   Zoster Vaccines- Shingrix (1 of 2) Never done   Pneumonia Vaccine 74+ Years old (1 - PCV) Never done   INFLUENZA VACCINE  08/31/2021   TETANUS/TDAP  12/05/2021   COLONOSCOPY (Pts 45-23yr Insurance coverage will need to be confirmed)  04/08/2024   Hepatitis C Screening  Completed   HPV VACCINES  Aged Out    Health Maintenance  Health Maintenance Due  Topic Date Due   COVID-19 Vaccine (1) Never done   Zoster Vaccines- Shingrix (1 of 2) Never done   Pneumonia Vaccine 67 Years old (1 - PCV) Never done   INFLUENZA VACCINE  08/31/2021    Colorectal cancer screening: Type of screening: Colonoscopy. Completed 04/09/19. Repeat every 5 years  Lung Cancer Screening: (Low Dose CT Chest recommended if Age 67-80years, 30 pack-year currently smoking OR have quit w/in 15years.) does not qualify.    Additional Screening:  Hepatitis C Screening: does qualify; Completed 12/29/20  Vision Screening: Recommended annual ophthalmology exams for early detection of glaucoma and other disorders of the eye. Is the patient up to date with their annual eye exam?  Yes  Who is the provider or what is the name of the office in which the patient attends annual eye exams? Dr.Woodard If pt is not established with a provider, would they like to be referred to a provider to establish care? No .   Dental Screening: Recommended annual dental exams for proper oral hygiene  Community Resource Referral / Chronic Care Management: CRR required this visit?  No   CCM required this visit?  No      Plan:     I have personally reviewed and noted the following in the  patient's chart:   Medical and social history Use of alcohol, tobacco or illicit drugs  Current medications and supplements including opioid prescriptions. Patient is not currently taking opioid prescriptions. Functional ability and status Nutritional status Physical activity Advanced directives List of other physicians Hospitalizations, surgeries, and ER visits in previous 12 months Vitals Screenings to include cognitive, depression, and falls Referrals and appointments  In addition, I have reviewed and discussed with patient certain preventive protocols, quality metrics, and best practice recommendations. A written personalized care plan for preventive services as well as general preventive health recommendations were provided to patient.     LDionisio David LPN   [id number]  Nurse Notes: none

## 2021-10-20 NOTE — Patient Instructions (Signed)
[name] , Thank you for taking time to come for your Medicare Wellness Visit. I appreciate your ongoing commitment to your health goals. Please review the following plan we discussed and let me know if I can assist you in the future.   Screening recommendations/referrals: Colonoscopy: 04/09/19 Recommended yearly ophthalmology/optometry visit for glaucoma screening and checkup Recommended yearly dental visit for hygiene and checkup  Vaccinations: Influenza vaccine: n/d Pneumococcal vaccine: n/d Tdap vaccine: 12/06/11 Shingles vaccine: n/d   Covid-19: says had shots at pharmacy  Advanced directives: no  Conditions/risks identified: none  Next appointment: Follow up in one year for your annual wellness visit. 10/24/22 @ 8:15 am in person  Preventive Care 67 Years and Older, Male Preventive care refers to lifestyle choices and visits with your health care provider that can promote health and wellness. What does preventive care include? A yearly physical exam. This is also called an annual well check. Dental exams once or twice a year. Routine eye exams. Ask your health care provider how often you should have your eyes checked. Personal lifestyle choices, including: Daily care of your teeth and gums. Regular physical activity. Eating a healthy diet. Avoiding tobacco and drug use. Limiting alcohol use. Practicing safe sex. Taking low doses of aspirin every day. Taking vitamin and mineral supplements as recommended by your health care provider. What happens during an annual well check? The services and screenings done by your health care provider during your annual well check will depend on your age, overall health, lifestyle risk factors, and family history of disease. Counseling  Your health care provider may ask you questions about your: Alcohol use. Tobacco use. Drug use. Emotional well-being. Home and relationship well-being. Sexual activity. Eating habits. History of  falls. Memory and ability to understand (cognition). Work and work Statistician. Screening  You may have the following tests or measurements: Height, weight, and BMI. Blood pressure. Lipid and cholesterol levels. These may be checked every 5 years, or more frequently if you are over 61 years old. Skin check. Lung cancer screening. You may have this screening every year starting at age 73 if you have a 30-pack-year history of smoking and currently smoke or have quit within the past 15 years. Fecal occult blood test (FOBT) of the stool. You may have this test every year starting at age 108. Flexible sigmoidoscopy or colonoscopy. You may have a sigmoidoscopy every 5 years or a colonoscopy every 10 years starting at age 108. Prostate cancer screening. Recommendations will vary depending on your family history and other risks. Hepatitis C blood test. Hepatitis B blood test. Sexually transmitted disease (STD) testing. Diabetes screening. This is done by checking your blood sugar (glucose) after you have not eaten for a while (fasting). You may have this done every 1-3 years. Abdominal aortic aneurysm (AAA) screening. You may need this if you are a current or former smoker. Osteoporosis. You may be screened starting at age 61 if you are at high risk. Talk with your health care provider about your test results, treatment options, and if necessary, the need for more tests. Vaccines  Your health care provider may recommend certain vaccines, such as: Influenza vaccine. This is recommended every year. Tetanus, diphtheria, and acellular pertussis (Tdap, Td) vaccine. You may need a Td booster every 10 years. Zoster vaccine. You may need this after age 38. Pneumococcal 13-valent conjugate (PCV13) vaccine. One dose is recommended after age 31. Pneumococcal polysaccharide (PPSV23) vaccine. One dose is recommended after age 78. Talk to your  health care provider about which screenings and vaccines you need and  how often you need them. This information is not intended to replace advice given to you by your health care provider. Make sure you discuss any questions you have with your health care provider. Document Released: 02/13/2015 Document Revised: 10/07/2015 Document Reviewed: 11/18/2014 Elsevier Interactive Patient Education  2017 [place] Prevention in the Home Falls can cause injuries. They can happen to people of all ages. There are many things you can do to make your home safe and to help prevent falls. What can I do on the outside of my home? Regularly fix the edges of walkways and driveways and fix any cracks. Remove anything that might make you trip as you walk through a door, such as a raised step or threshold. Trim any bushes or trees on the path to your home. Use bright outdoor lighting. Clear any walking paths of anything that might make someone trip, such as rocks or tools. Regularly check to see if handrails are loose or broken. Make sure that both sides of any steps have handrails. Any raised decks and porches should have guardrails on the edges. Have any leaves, snow, or ice cleared regularly. Use sand or salt on walking paths during winter. Clean up any spills in your garage right away. This includes oil or grease spills. What can I do in the bathroom? Use night lights. Install grab bars by the toilet and in the tub and shower. Do not use towel bars as grab bars. Use non-skid mats or decals in the tub or shower. If you need to sit down in the shower, use a plastic, non-slip stool. Keep the floor dry. Clean up any water that spills on the floor as soon as it happens. Remove soap buildup in the tub or shower regularly. Attach bath mats securely with double-sided non-slip rug tape. Do not have throw rugs and other things on the floor that can make you trip. What can I do in the bedroom? Use night lights. Make sure that you have a light by your bed that is easy to  reach. Do not use any sheets or blankets that are too big for your bed. They should not hang down onto the floor. Have a firm chair that has side arms. You can use this for support while you get dressed. Do not have throw rugs and other things on the floor that can make you trip. What can I do in the kitchen? Clean up any spills right away. Avoid walking on wet floors. Keep items that you use a lot in easy-to-reach places. If you need to reach something above you, use a strong step stool that has a grab bar. Keep electrical cords out of the way. Do not use floor polish or wax that makes floors slippery. If you must use wax, use non-skid floor wax. Do not have throw rugs and other things on the floor that can make you trip. What can I do with my stairs? Do not leave any items on the stairs. Make sure that there are handrails on both sides of the stairs and use them. Fix handrails that are broken or loose. Make sure that handrails are as long as the stairways. Check any carpeting to make sure that it is firmly attached to the stairs. Fix any carpet that is loose or worn. Avoid having throw rugs at the top or bottom of the stairs. If you do have throw rugs, attach them  to the floor with carpet tape. Make sure that you have a light switch at the top of the stairs and the bottom of the stairs. If you do not have them, ask someone to add them for you. What else can I do to help prevent falls? Wear shoes that: Do not have high heels. Have rubber bottoms. Are comfortable and fit you well. Are closed at the toe. Do not wear sandals. If you use a stepladder: Make sure that it is fully opened. Do not climb a closed stepladder. Make sure that both sides of the stepladder are locked into place. Ask someone to hold it for you, if possible. Clearly mark and make sure that you can see: Any grab bars or handrails. First and last steps. Where the edge of each step is. Use tools that help you move  around (mobility aids) if they are needed. These include: Canes. Walkers. Scooters. Crutches. Turn on the lights when you go into a dark area. Replace any light bulbs as soon as they burn out. Set up your furniture so you have a clear path. Avoid moving your furniture around. If any of your floors are uneven, fix them. If there are any pets around you, be aware of where they are. Review your medicines with your doctor. Some medicines can make you feel dizzy. This can increase your chance of falling. Ask your doctor what other things that you can do to help prevent falls. This information is not intended to replace advice given to you by your health care provider. Make sure you discuss any questions you have with your health care provider. Document Released: 11/13/2008 Document Revised: 06/25/2015 Document Reviewed: 02/21/2014 Elsevier Interactive Patient Education  2017 [employer].

## 2022-01-04 NOTE — Progress Notes (Unsigned)
Complete physical exam   Patient: Terry Liu   DOB: December 09, 1955   66 y.o. Male  MRN: [id number] Visit Date: 01/05/2022  Today's healthcare provider: Myles Gip, DO   No chief complaint on file.  Subjective    Terry Liu is a 66 y.o. male who presents today for a complete physical exam.  He reports consuming a general diet.  He generally feels well. He reports sleeping well. He does have additional problems to discuss today.  HPI  Patient had AWV done 10/20/2021.  Neuropathy - still having some trouble at night. Taking gabapentin '600mg'$  qhs.  Palpitations - no longer taking metoprolol x3 years. No issues with palpitations since. Dx was frequent PVCs. Sees Cardiology.   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 Sees Optho. Also with b/l carotid stenosis, Cards wants on atorvastatin, hasn't started yet..   OSA - compliant with CPAP.   Chronic DVT - on eliquis w/o issue.   Past Medical History:  Diagnosis Date   Arthritis    Osteoarthritis   DVT of leg (deep venous thrombosis) ([place]) 2013   LLE/Dr Cynda Acres   GERD (gastroesophageal reflux disease)    History of blood clots 2014   Hyperlipidemia    Sleep apnea    has cpap   Past Surgical History:  Procedure Laterality Date   COLONOSCOPY  2010   COLONOSCOPY WITH PROPOFOL N/A 04/09/2019   Procedure: COLONOSCOPY WITH PROPOFOL;  Surgeon: Lucilla Lame, MD;  Location: [facility] ENDOSCOPY;  Service: Endoscopy;  Laterality: N/A;   INSERTION OF MESH N/A 10/25/2019   Procedure: INSERTION OF MESH;  Surgeon: Robert Bellow, MD;  Location: [facility] ORS;  Service: General;  Laterality: N/A;   TONSILLECTOMY     UMBILICAL HERNIA REPAIR N/A 10/25/2019   Procedure: HERNIA REPAIR UMBILICAL ADULT;  Surgeon: Robert Bellow, MD;  Location: [facility] ORS;  Service: General;  Laterality: N/A;   Social History   Socioeconomic History   Marital status: Married    Spouse name: Not on file   Number of children: Not on file    Years of education: Not on file   Highest education level: Not on file  Occupational History   Not on file  Tobacco Use   Smoking status: Never   Smokeless tobacco: Never  Vaping Use   Vaping Use: Never used  Substance and Sexual Activity   Alcohol use: No   Drug use: No   Sexual activity: Not on file  Other Topics Concern   Not on file  Social History Narrative   Not on file   Social Determinants of Health   Financial Resource Strain: Low Risk  (10/20/2021)   Overall Financial Resource Strain (CARDIA)    Difficulty of Paying Living Expenses: Not hard at all  Food Insecurity: No Food Insecurity (10/20/2021)   Hunger Vital Sign    Worried About Running Out of Food in the Last Year: Never true    Ran Out of Food in the Last Year: Never true  Transportation Needs: No Transportation Needs (10/20/2021)   PRAPARE - Hydrologist (Medical): No    Lack of Transportation (Non-Medical): No  Physical Activity: Insufficiently Active (10/20/2021)   Exercise Vital Sign    Days of Exercise per Week: 3 days    Minutes of Exercise per Session: 30 min  Stress: No Stress Concern Present (10/20/2021)   [place]  Feeling of Stress : Not at all  Social Connections: Moderately Integrated (10/20/2021)   Social Connection and Isolation Panel [NHANES]    Frequency of Communication with Friends and Family: More than three times a week    Frequency of Social Gatherings with Friends and Family: More than three times a week    Attends Religious Services: More than 4 times per year    Active Member of [employer] or Organizations: No    Attends Archivist Meetings: Never    Marital Status: Married  Human resources officer Violence: Not At Risk (10/20/2021)   Humiliation, Afraid, Rape, and Kick questionnaire    Fear of Current or Ex-Partner: No    Emotionally Abused: No    Physically Abused: No    Sexually  Abused: No   Family Status  Relation Name Status   Mother  Deceased at age 67       breast and lung cancer   Father  Deceased at age 87       coronary Artery disease, Nicotine Dependence, Mi's x4 with first being at age 47.   Brother  Risk analyst  Alive       Myocardial Infarction at an early age   Sister 60 Alive       Possible Melanoma, Gastric bypass   Sister 1 Alive   Family History  Problem Relation Age of Onset   Lung cancer Mother    Breast cancer Mother 97   Lung cancer Father    Diabetes Sister    Melanoma Sister        benign breast tumor   No Known Allergies  Patient Care Team: [name], MD as PCP - General (Family Medicine) [name], MD (Family Medicine) Bary Castilla, Forest Gleason, MD (General Surgery) Minna Merritts, MD as Consulting Physician (Cardiology)   Medications: Outpatient Medications Prior to Visit  Medication Sig   ELIQUIS 5 MG TABS tablet TAKE 1 TABLET BY MOUTH TWICE DAILY   gabapentin (NEURONTIN) 300 MG capsule TAKE 2 CAPSULES(600 MG) BY MOUTH AT BEDTIME   oxymetazoline (AFRIN) 0.05 % nasal spray Place 1 spray into both nostrils 2 (two) times daily as needed for congestion.   atorvastatin (LIPITOR) 20 MG tablet Take by mouth. (Patient not taking: Reported on 10/20/2021)   [DISCONTINUED] aspirin EC 81 MG tablet Take by mouth. (Patient not taking: Reported on 10/20/2021)   [DISCONTINUED] metoprolol succinate (TOPROL-XL) 25 MG 24 hr tablet TAKE 1 TABLET(25 MG) BY MOUTH DAILY (Patient not taking: No sig reported)   No facility-administered medications prior to visit.    Review of Systems    Objective    BP 122/85 (BP Location: Left Arm, Patient Position: Sitting, Cuff Size: Large)   Pulse 71   Ht 6' (1.829 m)   Wt 297 lb 6.4 oz (134.9 kg)   SpO2 95%   BMI 40.33 kg/m     Physical Exam Constitutional:      Appearance: He is obese.  HENT:     Head: Normocephalic.     Right Ear: Tympanic membrane, ear canal  and external ear normal.     Left Ear: Tympanic membrane, ear canal and external ear normal.     Nose: Nose normal.     Mouth/Throat:     Mouth: Mucous membranes are moist.     Pharynx: Oropharynx is clear.  Eyes:     Extraocular Movements: Extraocular movements intact.     Pupils: Pupils are equal,  round, and reactive to light.  Cardiovascular:     Rate and Rhythm: Normal rate and regular rhythm.     Heart sounds: Normal heart sounds. No murmur heard. Pulmonary:     Effort: Pulmonary effort is normal.     Breath sounds: Normal breath sounds.  Abdominal:     General: Bowel sounds are normal.     Palpations: Abdomen is soft.     Tenderness: There is no abdominal tenderness.  Musculoskeletal:        General: Normal range of motion.     Right lower leg: No edema.     Left lower leg: No edema.  Skin:    General: Skin is warm and dry.  Neurological:     Mental Status: He is alert and oriented to person, place, and time.     Gait: Gait normal.  Psychiatric:        Mood and Affect: Mood normal.      Last depression screening scores    01/05/2022    8:25 AM 10/20/2021    8:20 AM 09/29/2020    9:37 AM  PHQ 2/9 Scores  PHQ - 2 Score 0 0 0  PHQ- 9 Score 1 0 2   Last fall risk screening    01/05/2022    8:25 AM  [place] in the past year? 0  Number falls in past yr: 0  Injury with Fall? 0  Risk for fall due to : No Fall Risks  Follow up Falls evaluation completed   Last Audit-C alcohol use screening    01/05/2022    8:24 AM  Alcohol Use Disorder Test (AUDIT)  1. How often do you have a drink containing alcohol? 0  2. How many drinks containing alcohol do you have on a typical day when you are drinking? 0  3. How often do you have six or more drinks on one occasion? 0  AUDIT-C Score 0   A score of 3 or more in women, and 4 or more in men indicates increased risk for alcohol abuse, EXCEPT if all of the points are from question 1   No results found for any  visits on 01/05/22.  Assessment & Plan    Routine Health Maintenance and Physical Exam  Exercise Activities and Dietary recommendations  Goals      DIET - EAT MORE FRUITS AND VEGETABLES        Immunization History  Administered Date(s) Administered   Influenza,inj,Quad PF,6+ Mos 11/07/2014, 11/07/2015, 01/02/2017, 10/07/2017, 10/16/2018   Influenza-Unspecified 10/31/2021   Rabies, IM 11/18/2016, 11/21/2016, 11/25/2016, 12/02/2016   Tdap 12/06/2011    Health Maintenance  Topic Date Due   COVID-19 Vaccine (1) Never done   Zoster Vaccines- Shingrix (1 of 2) Never done   DTaP/Tdap/Td (2 - Td or Tdap) 12/05/2021   Pneumonia Vaccine 35+ Years old (1 - PCV) 01/06/2023 (Originally 02/16/2019)   Medicare Annual Wellness (AWV)  10/21/2022   COLONOSCOPY (Pts 45-30yr Insurance coverage will need to be confirmed)  04/08/2024   INFLUENZA VACCINE  Completed   Hepatitis C Screening  Completed   HPV VACCINES  Aged Out    Discussed health benefits of physical activity, and encouraged him to engage in regular exercise appropriate for his age and condition.  Problem List Items Addressed This Visit       Cardiovascular and Mediastinum   Amaurosis fugax of left eye    Continue to follow with Optho.  Bilateral carotid artery stenosis   Relevant Orders   Comprehensive metabolic panel   Lipid panel   Hemoglobin A1c   Chronic deep vein thrombosis (DVT) of popliteal vein of left lower extremity (HCC)    Tolerant of eliquis, continue.       Relevant Orders   CBC with Differential/Platelet   Frequent PVCs     Respiratory   OSA (obstructive sleep apnea)     Nervous and Auditory   Neuropathy    Continue gabapentin, increase qhs dose as needed.        Other   Hyperlipidemia, mixed    Recheck labs. Encouraged atorvastatin compliance.      Relevant Orders   Comprehensive metabolic panel   Lipid panel   Hemoglobin A1c   Obesity    Contributing to HLD, occasional back  pain. Recommend weight loss through diet and exercise. Obtaining labs today.       Relevant Orders   Comprehensive metabolic panel   Lipid panel   Hemoglobin A1c   Palpitations   Other Visit Diagnoses     Annual physical exam    -  Primary   Relevant Orders   CBC with Differential/Platelet   Comprehensive metabolic panel   Lipid panel   PSA   Hemoglobin A1c   Need for pneumococcal 20-valent conjugate vaccination            Return in about 6 months (around 07/07/2022) for chronic dz f/u.      Myles Gip, [place] [phone] (phone) [phone] (fax)  [place]

## 2022-01-05 ENCOUNTER — Ambulatory Visit (INDEPENDENT_AMBULATORY_CARE_PROVIDER_SITE_OTHER): Payer: Medicare Other | Admitting: Family Medicine

## 2022-01-05 ENCOUNTER — Encounter: Payer: Self-pay | Admitting: Family Medicine

## 2022-01-05 VITALS — BP 122/85 | HR 71 | Ht 72.0 in | Wt 297.4 lb

## 2022-01-05 DIAGNOSIS — G629 Polyneuropathy, unspecified: Secondary | ICD-10-CM

## 2022-01-05 DIAGNOSIS — G4733 Obstructive sleep apnea (adult) (pediatric): Secondary | ICD-10-CM

## 2022-01-05 DIAGNOSIS — Z23 Encounter for immunization: Secondary | ICD-10-CM

## 2022-01-05 DIAGNOSIS — R002 Palpitations: Secondary | ICD-10-CM

## 2022-01-05 DIAGNOSIS — I493 Ventricular premature depolarization: Secondary | ICD-10-CM

## 2022-01-05 DIAGNOSIS — I6523 Occlusion and stenosis of bilateral carotid arteries: Secondary | ICD-10-CM

## 2022-01-05 DIAGNOSIS — Z0000 Encounter for general adult medical examination without abnormal findings: Secondary | ICD-10-CM

## 2022-01-05 DIAGNOSIS — Z6839 Body mass index (BMI) 39.0-39.9, adult: Secondary | ICD-10-CM

## 2022-01-05 DIAGNOSIS — I82532 Chronic embolism and thrombosis of left popliteal vein: Secondary | ICD-10-CM

## 2022-01-05 DIAGNOSIS — G453 Amaurosis fugax: Secondary | ICD-10-CM

## 2022-01-05 DIAGNOSIS — E782 Mixed hyperlipidemia: Secondary | ICD-10-CM

## 2022-01-05 NOTE — Assessment & Plan Note (Signed)
Contributing to HLD, occasional back pain. Recommend weight loss through diet and exercise. Obtaining labs today.

## 2022-01-05 NOTE — Assessment & Plan Note (Signed)
Tolerant of eliquis, continue.

## 2022-01-05 NOTE — Assessment & Plan Note (Signed)
Continue to follow with Optho.

## 2022-01-05 NOTE — Assessment & Plan Note (Addendum)
Recheck labs. Encouraged atorvastatin compliance.

## 2022-01-05 NOTE — Patient Instructions (Signed)

## 2022-01-05 NOTE — Assessment & Plan Note (Signed)
Continue gabapentin, increase qhs dose as needed.

## 2022-01-06 LAB — CBC WITH DIFFERENTIAL/PLATELET
Basophils Absolute: 0 x10E3/uL (ref 0.0–0.2)
Basos: 0 %
EOS (ABSOLUTE): 0.2 x10E3/uL (ref 0.0–0.4)
Eos: 3 %
Hematocrit: 44.3 % (ref 37.5–51.0)
Hemoglobin: 14.9 g/dL (ref 13.0–17.7)
Immature Grans (Abs): 0.1 x10E3/uL (ref 0.0–0.1)
Immature Granulocytes: 1 %
Lymphocytes Absolute: 1.9 x10E3/uL (ref 0.7–3.1)
Lymphs: 31 %
MCH: 29.9 pg (ref 26.6–33.0)
MCHC: 33.6 g/dL (ref 31.5–35.7)
MCV: 89 fL (ref 79–97)
Monocytes Absolute: 0.7 x10E3/uL (ref 0.1–0.9)
Monocytes: 11 %
Neutrophils Absolute: 3.3 x10E3/uL (ref 1.4–7.0)
Neutrophils: 54 %
Platelets: 263 x10E3/uL (ref 150–450)
RBC: 4.98 x10E6/uL (ref 4.14–5.80)
RDW: 12.1 % (ref 11.6–15.4)
WBC: 6.2 x10E3/uL (ref 3.4–10.8)

## 2022-01-06 LAB — LIPID PANEL
Chol/HDL Ratio: 5 ratio (ref 0.0–5.0)
Cholesterol, Total: 215 mg/dL — ABNORMAL HIGH (ref 100–199)
HDL: 43 mg/dL (ref >39)
LDL Chol Calc (NIH): 146 mg/dL — ABNORMAL HIGH (ref 0–99)
Triglycerides: 141 mg/dL (ref 0–149)
VLDL Cholesterol Cal: 26 mg/dL (ref 5–40)

## 2022-01-06 LAB — COMPREHENSIVE METABOLIC PANEL
ALT: 27 IU/L (ref 0–44)
AST: 11 IU/L (ref 0–40)
Albumin/Globulin Ratio: 2.1 (ref 1.2–2.2)
Albumin: 4.6 g/dL (ref 3.9–4.9)
Alkaline Phosphatase: 88 IU/L (ref 44–121)
BUN/Creatinine Ratio: 13 (ref 10–24)
BUN: 16 mg/dL (ref 8–27)
Bilirubin Total: 0.6 mg/dL (ref 0.0–1.2)
CO2: 22 mmol/L (ref 20–29)
Calcium: 9.3 mg/dL (ref 8.6–10.2)
Chloride: 106 mmol/L (ref 96–106)
Creatinine, Ser: 1.19 mg/dL (ref 0.76–1.27)
Globulin, Total: 2.2 g/dL (ref 1.5–4.5)
Glucose: 102 mg/dL — ABNORMAL HIGH (ref 70–99)
Potassium: 4.7 mmol/L (ref 3.5–5.2)
Sodium: 144 mmol/L (ref 134–144)
Total Protein: 6.8 g/dL (ref 6.0–8.5)
eGFR: 67 mL/min/{1.73_m2} (ref >59)

## 2022-01-06 LAB — HEMOGLOBIN A1C
Est. average glucose Bld gHb Est-mCnc: 123 mg/dL
Hgb A1c MFr Bld: 5.9 % — ABNORMAL HIGH (ref 4.8–5.6)

## 2022-01-06 LAB — PSA: Prostate Specific Ag, Serum: 0.9 ng/mL (ref 0.0–4.0)

## 2022-03-17 ENCOUNTER — Ambulatory Visit: Payer: Self-pay

## 2022-03-17 NOTE — Progress Notes (Signed)
I,Sulibeya S Dimas,acting as a Education administrator for [employer], PA-C.,have documented all relevant documentation on the behalf of [name], PA-C,as directed by  [name], PA-C while in the presence of [name], PA-C.     Established patient visit   Patient: Terry Liu   DOB: [birth year]   67 y.o. Male  MRN: [id number] Visit Date: 03/18/2022  Today's healthcare provider: [name], PA-C   Chief Complaint  Patient presents with   Leg Pain   Subjective    Leg Pain  The incident occurred 3 to 5 days ago. There was no injury mechanism. The pain is present in the right leg. The pain is at a severity of 8/10. The pain is moderate. The pain has been Worsening since onset. Associated symptoms include an inability to bear weight and numbness. The symptoms are aggravated by weight bearing and movement.  He reports taking gabapentin for neuropathy.  Pain is primarily at his right hip/groin and radiates down his leg. Denies rashes, color changes. Thinks there might be swelling in his upper leg. Denies swelling or paresthesias in his lower leg. Reports needing to walk with a limp and being unable to 100% weight bear on the right side.  Medications: Outpatient Medications Prior to Visit  Medication Sig   atorvastatin (LIPITOR) 20 MG tablet Take by mouth.   ELIQUIS 5 MG TABS tablet TAKE 1 TABLET BY MOUTH TWICE DAILY   gabapentin (NEURONTIN) 300 MG capsule TAKE 2 CAPSULES(600 MG) BY MOUTH AT BEDTIME   oxymetazoline (AFRIN) 0.05 % nasal spray Place 1 spray into both nostrils 2 (two) times daily as needed for congestion.   No facility-administered medications prior to visit.    Review of Systems  Constitutional:  Negative for chills and fever.  Respiratory:  Negative for chest tightness and shortness of breath.   Cardiovascular:  Positive for leg swelling. Negative for chest pain.  Neurological:  Positive for numbness.     Objective    BP 118/83 (BP Location: Left Arm,  Patient Position: Sitting, Cuff Size: Large)   Pulse 74   Temp 98.1 F (36.7 C) (Temporal)   Resp 16   Wt (!) 305 lb (138.3 kg)   SpO2 99%   BMI 41.37 kg/m   Physical Exam Vitals reviewed.  Constitutional:      Appearance: He is not ill-appearing.  HENT:     Head: Normocephalic.  Eyes:     Conjunctiva/sclera: Conjunctivae normal.  Cardiovascular:     Rate and Rhythm: Normal rate.  Pulmonary:     Effort: Pulmonary effort is normal. No respiratory distress.  Musculoskeletal:     Comments: Minimal edema to right thigh No edema to RLE no color changes, excessive warmth or pallor to right calf  Neurological:     General: No focal deficit present.     Mental Status: He is alert and oriented to person, place, and time.  Psychiatric:        Mood and Affect: Mood normal.        Behavior: Behavior normal.      No results found for any visits on 03/18/22.  Assessment & Plan     Right leg/hip pain 2. History of DVT Pt is prophylaxed with eliquis 5 mg bid  Given history, ordered right dvt study Suspicion for bursitis/arthritis  Ordered right hip and lumbar films Rx medrol dose pack, okay to take tylenol   Advised ed precautions if --numbness, swelling, unable to weight bear/walk at all, unable to  pick foot up  Return if symptoms worsen or fail to improve.      I, [name], PA-C have reviewed all documentation for this visit. The documentation on  03/18/22  for the exam, diagnosis, procedures, and orders are all accurate and complete.  [name], PA-C [facility] [address] [place], [place], [address] Office: [phone] Fax: [place]

## 2022-03-17 NOTE — Telephone Encounter (Signed)
Chief Complaint: Right leg pain from hip to leg Symptoms: 8/10 pain when getting up to walk, hard to walk due to pain Frequency: Onset last 3-4 days Pertinent Negatives: Patient denies other symptoms Disposition: []$ ED /[]$ Urgent Care (no appt availability in office) / [x]$ Appointment(In office/virtual)/ []$  [place] [facility]/ []$ Home Care/ []$ Refused Recommended Disposition /[]$ [place] [facility]/ []$  Follow-up with PCP Additional Notes: Scheduled tomorrow.   Reason for Disposition  [1] MODERATE pain (e.g., interferes with normal activities, limping) AND [2] present > 3 days  Answer Assessment - Initial Assessment Questions 1. ONSET: "When did the pain start?"      Last 3-4 days 2. LOCATION: "Where is the pain located?"      From hip down right leg 3. PAIN: "How bad is the pain?"    (Scale 1-10; or mild, moderate, severe)   -  MILD (1-3): doesn't interfere with normal activities    -  MODERATE (4-7): interferes with normal activities (e.g., work or school) or awakens from sleep, limping    -  SEVERE (8-10): excruciating pain, unable to do any normal activities, unable to walk     8/10 4. WORK OR EXERCISE: "Has there been any recent work or exercise that involved this part of the body?"      No 5. CAUSE: "What do you think is causing the leg pain?"     I have neuropathy, but not sure of this pain 6. OTHER SYMPTOMS: "Do you have any other symptoms?" (e.g., chest pain, back pain, breathing difficulty, swelling, rash, fever, numbness, weakness)     No  Protocols used: Leg Pain-A-AH

## 2022-03-18 ENCOUNTER — Ambulatory Visit
Admission: RE | Admit: 2022-03-18 | Discharge: 2022-03-18 | Disposition: A | Discharge status: Home / Self Care | Payer: Medicare Other | Source: Ambulatory Visit | Attending: Physician Assistant | Admitting: Physician Assistant

## 2022-03-18 ENCOUNTER — Ambulatory Visit (INDEPENDENT_AMBULATORY_CARE_PROVIDER_SITE_OTHER): Payer: Medicare Other | Admitting: Physician Assistant

## 2022-03-18 ENCOUNTER — Other Ambulatory Visit: Payer: Self-pay | Admitting: Physician Assistant

## 2022-03-18 ENCOUNTER — Ambulatory Visit
Admission: RE | Admit: 2022-03-18 | Discharge: 2022-03-18 | Disposition: A | Discharge status: Home / Self Care | Payer: Medicare Other | Attending: Physician Assistant | Admitting: Physician Assistant

## 2022-03-18 ENCOUNTER — Telehealth: Payer: Self-pay

## 2022-03-18 ENCOUNTER — Encounter: Payer: Self-pay | Admitting: Physician Assistant

## 2022-03-18 VITALS — BP 118/83 | HR 74 | Temp 98.1°F | Resp 16 | Wt 305.0 lb

## 2022-03-18 DIAGNOSIS — M79604 Pain in right leg: Secondary | ICD-10-CM

## 2022-03-18 DIAGNOSIS — Z86718 Personal history of other venous thrombosis and embolism: Secondary | ICD-10-CM | POA: Diagnosis not present

## 2022-03-18 DIAGNOSIS — M25551 Pain in right hip: Secondary | ICD-10-CM | POA: Diagnosis not present

## 2022-03-18 DIAGNOSIS — M7989 Other specified soft tissue disorders: Secondary | ICD-10-CM | POA: Diagnosis not present

## 2022-03-18 DIAGNOSIS — M24851 Other specific joint derangements of right hip, not elsewhere classified: Secondary | ICD-10-CM

## 2022-03-18 DIAGNOSIS — M545 Low back pain, unspecified: Secondary | ICD-10-CM | POA: Diagnosis not present

## 2022-03-18 MED ORDER — METHYLPREDNISOLONE 4 MG PO TBPK: ORAL_TABLET | ORAL | 0 refills | Status: AC

## 2022-03-18 NOTE — Telephone Encounter (Signed)
Copied from [place] [id number]. Topic: General - Other >> Mar 18, 2022 12:16 PM [id number] J wrote: Reason for CRM: pt returned office call. Pt had labs imaging today and would like to discuss.   Please assist pt further.

## 2022-03-18 NOTE — Telephone Encounter (Signed)
Pt given lab results per notes of [name], PA-C on 03/18/22. Pt verbalized understanding. He asks should he still have the ultrasound, I advised yes go ahead and have it done so that all will be done to evaluate what is going on. He says he will go.     [name], PA-C 03/18/2022  9:16 AM EST   Lumbar xray looks fine.   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 I think this is probably what he is experiencing. The steroids will help the pain but he should see an orthopedic specialist. Referral placed.

## 2022-03-18 NOTE — Telephone Encounter (Signed)
Patient advised of imaging result.

## 2022-03-21 ENCOUNTER — Telehealth: Payer: Self-pay

## 2022-03-21 DIAGNOSIS — M1611 Unilateral primary osteoarthritis, right hip: Secondary | ICD-10-CM | POA: Diagnosis not present

## 2022-03-21 NOTE — Telephone Encounter (Signed)
Patient advised to contact imaging center to receive disk. Patient states he is there now.

## 2022-03-21 NOTE — Telephone Encounter (Signed)
Copied from [place] [phone]. Topic: General - Other >> Mar 18, 2022  3:07 PM Ja-Kwan M wrote: Reason for CRM: Pt stated he received a referral and the appt is Monday at 10 am at the ortho office. Pt stated he was told to bring in the results of the imaging on a cd. Pt requests that the imaging be put on a cd so he can take it with him to his appt on Monday.

## 2022-03-24 ENCOUNTER — Other Ambulatory Visit: Payer: Self-pay | Admitting: Family Medicine

## 2022-03-24 DIAGNOSIS — G629 Polyneuropathy, unspecified: Secondary | ICD-10-CM

## 2022-03-24 NOTE — Telephone Encounter (Signed)
Medication Refill - Medication:  gabapentin (NEURONTIN) 300 MG capsule.  Patient states Dr. Rosanna Randy advised to take 3 pills daily and that is why he will run out. Patient is going on a cruise in March and pharmacy states they will not refill until 04/08/2022. Patient request a 30 day supply and a new script.   Has the patient contacted their pharmacy? Yes.     Preferred Pharmacy (with phone number or street name):   [facility], [place] AT [place] Phone: [phone]  Fax: [phone]      Has the patient been seen for an appointment in the last year OR does the patient have an upcoming appointment? Yes.    Agent: Please be advised that RX refills may take up to 3 business days. We ask that you follow-up with your pharmacy.

## 2022-03-24 NOTE — Telephone Encounter (Signed)
Requested medications are due for refill today.  Provider to determine  Requested medications are on the active medications list.  yes  Last refill. 10/14/2021 #180 3 rf  Future visit scheduled.   no  Notes to clinic.  Dr. Rosanna Randy listed as PCP. Note from pt:  Mabe, Lynnea Maizes, RN  to [facility] Nurse Triage Pool      03/24/22  1:19 PM Pt states is taking 3 tabs instead of 2 as prescribed. From CPE notes on 01/05/22, Neuropathy: Continue gabapentin, increase qhs dose as needed.    Requested Prescriptions  Pending Prescriptions Disp Refills   gabapentin (NEURONTIN) 300 MG capsule 180 capsule 3     Neurology: Anticonvulsants - gabapentin Passed - 03/24/2022  1:19 PM      Passed - Cr in normal range and within 360 days    Creat  Date Value Ref Range Status  12/14/2016 1.02 0.70 - 1.25 mg/dL Final    Comment:    For patients >61 years of age, the reference limit for Creatinine is approximately 13% higher for people identified as African-American. .    Creatinine, Ser  Date Value Ref Range Status  01/05/2022 1.19 0.76 - 1.27 mg/dL Final         Passed - Completed PHQ-2 or PHQ-9 in the last 360 days      Passed - Valid encounter within last 12 months    Recent Outpatient Visits           6 days ago Right leg pain   [place] [name], PA-C   2 months ago Annual physical exam   [facility] Myles Gip, DO   1 year ago Encounter for [employer] annual wellness exam   [facility] Eulas Post, MD   2 years ago Neuropathy   [place] Eulas Post, MD   2 years ago Neuropathy   [place] Eulas Post, MD

## 2022-03-28 ENCOUNTER — Telehealth: Payer: Self-pay | Admitting: Family Medicine

## 2022-03-28 ENCOUNTER — Other Ambulatory Visit: Payer: Self-pay | Admitting: Physician Assistant

## 2022-03-28 DIAGNOSIS — G629 Polyneuropathy, unspecified: Secondary | ICD-10-CM

## 2022-03-28 MED ORDER — GABAPENTIN 300 MG PO CAPS: 300.0000 mg | ORAL_CAPSULE | Freq: Three times a day (TID) | ORAL | 0 refills | Status: AC

## 2022-03-28 NOTE — Telephone Encounter (Signed)
LRF #180 [id number] 10/14/21 Patient reports to be taking 3 times daily and not two tabs at bedtime

## 2022-03-28 NOTE — Telephone Encounter (Signed)
Patient called again stating he needs a new RX sent for gabapentin 300 mg. To take 1 pill three times a day(or that's what patient said Dr. Rosanna Randy told him)  He is stating his insurance will not cover a refill with the directions being bid because he is getting the refill too early.  He called Friday about this and has not heard anything from [place].    Please call patient back today and let him know if this RX is going to be sent in.

## 2022-03-28 NOTE — Telephone Encounter (Signed)
Patient advised. Verbalized understanding

## 2022-04-12 DIAGNOSIS — L905 Scar conditions and fibrosis of skin: Secondary | ICD-10-CM | POA: Diagnosis not present

## 2022-04-12 DIAGNOSIS — C4441 Basal cell carcinoma of skin of scalp and neck: Secondary | ICD-10-CM | POA: Diagnosis not present

## 2022-04-14 DIAGNOSIS — M5416 Radiculopathy, lumbar region: Secondary | ICD-10-CM | POA: Diagnosis not present

## 2022-04-14 DIAGNOSIS — M25551 Pain in right hip: Secondary | ICD-10-CM | POA: Diagnosis not present

## 2022-05-06 DIAGNOSIS — M5416 Radiculopathy, lumbar region: Secondary | ICD-10-CM | POA: Diagnosis not present

## 2022-05-06 DIAGNOSIS — M25551 Pain in right hip: Secondary | ICD-10-CM | POA: Diagnosis not present

## 2022-05-09 DIAGNOSIS — M1611 Unilateral primary osteoarthritis, right hip: Secondary | ICD-10-CM | POA: Diagnosis not present

## 2022-05-09 DIAGNOSIS — M5416 Radiculopathy, lumbar region: Secondary | ICD-10-CM | POA: Diagnosis not present

## 2022-05-17 DIAGNOSIS — R002 Palpitations: Secondary | ICD-10-CM | POA: Diagnosis not present

## 2022-05-17 DIAGNOSIS — R0602 Shortness of breath: Secondary | ICD-10-CM | POA: Diagnosis not present

## 2022-05-17 DIAGNOSIS — I82532 Chronic embolism and thrombosis of left popliteal vein: Secondary | ICD-10-CM | POA: Diagnosis not present

## 2022-05-17 DIAGNOSIS — G4733 Obstructive sleep apnea (adult) (pediatric): Secondary | ICD-10-CM | POA: Diagnosis not present

## 2022-05-17 DIAGNOSIS — R9431 Abnormal electrocardiogram [ECG] [EKG]: Secondary | ICD-10-CM | POA: Diagnosis not present

## 2022-05-17 DIAGNOSIS — E782 Mixed hyperlipidemia: Secondary | ICD-10-CM | POA: Diagnosis not present

## 2022-05-18 DIAGNOSIS — M5416 Radiculopathy, lumbar region: Secondary | ICD-10-CM | POA: Diagnosis not present

## 2022-05-18 DIAGNOSIS — M25551 Pain in right hip: Secondary | ICD-10-CM | POA: Diagnosis not present

## 2022-05-23 DIAGNOSIS — M5416 Radiculopathy, lumbar region: Secondary | ICD-10-CM | POA: Diagnosis not present

## 2022-05-23 DIAGNOSIS — M25551 Pain in right hip: Secondary | ICD-10-CM | POA: Diagnosis not present

## 2022-05-23 DIAGNOSIS — M7061 Trochanteric bursitis, right hip: Secondary | ICD-10-CM | POA: Diagnosis not present

## 2022-08-05 ENCOUNTER — Other Ambulatory Visit: Payer: Self-pay | Admitting: Physician Assistant

## 2022-08-05 NOTE — Telephone Encounter (Signed)
Requested medication (s) are due for refill today: yes  Requested medication (s) are on the active medication list: yes  Last refill:  06/10/20 #180/0  Future visit scheduled: no  Notes to clinic:  Dr. Sullivan Lone pt, had CPE 01/05/22 with Dr. Linwood Dibbles     Requested Prescriptions  Pending Prescriptions Disp Refills   apixaban (ELIQUIS) 5 MG TABS tablet 180 tablet 0    Sig: Take 1 tablet (5 mg total) by mouth 2 (two) times daily.     Hematology:  Anticoagulants - apixaban Passed - 08/05/2022  5:25 PM      Passed - PLT in normal range and within 360 days    Platelets  Date Value Ref Range Status  01/05/2022 263 150 - 450 x10E3/uL Final         Passed - HGB in normal range and within 360 days    Hemoglobin  Date Value Ref Range Status  01/05/2022 14.9 13.0 - 17.7 g/dL Final         Passed - HCT in normal range and within 360 days    Hematocrit  Date Value Ref Range Status  01/05/2022 44.3 37.5 - 51.0 % Final         Passed - Cr in normal range and within 360 days    Creat  Date Value Ref Range Status  12/14/2016 1.02 0.70 - 1.25 mg/dL Final    Comment:    For patients >39 years of age, the reference limit for Creatinine is approximately 13% higher for people identified as African-American. .    Creatinine, Ser  Date Value Ref Range Status  01/05/2022 1.19 0.76 - 1.27 mg/dL Final         Passed - AST in normal range and within 360 days    AST  Date Value Ref Range Status  01/05/2022 11 0 - 40 IU/L Final         Passed - ALT in normal range and within 360 days    ALT  Date Value Ref Range Status  01/05/2022 27 0 - 44 IU/L Final         Passed - Valid encounter within last 12 months    Recent Outpatient Visits           4 months ago Right leg pain   [place] [facility] Alfredia Ferguson, PA-C   7 months ago Annual physical exam   [facility] Caro Laroche, DO   1 year ago Encounter for [employer] annual wellness  exam   [place] Bosie Clos, MD   2 years ago Neuropathy   [facility] Bosie Clos, MD   3 years ago Neuropathy   [facility] Bosie Clos, MD

## 2022-08-05 NOTE — Telephone Encounter (Signed)
Medication Refill - Medication: ELIQUIS 5 MG TABS tablet   Has the patient contacted their pharmacy? No.   Preferred Pharmacy (with phone number or street name): [id number] - [place], [place] - [address] AT [facility] [address] [facility] Phone: [phone]  Fax: [phone]    Has the patient been seen for an appointment in the last year OR does the patient have an upcoming appointment? Yes.    The patient states he only has enough left to las the weekend and needs as soon as possible. Please assist patient further

## 2022-08-08 ENCOUNTER — Other Ambulatory Visit: Payer: Self-pay | Admitting: Physician Assistant

## 2022-08-08 ENCOUNTER — Telehealth: Payer: Self-pay | Admitting: Physician Assistant

## 2022-08-08 NOTE — Telephone Encounter (Signed)
Medication Refill - Medication: ELIQUIS 5 MG TABS tablet   Pt stated he is traveling out of town and needs his medication.   Has the patient contacted their pharmacy? Yes.    (Agent: If yes, when and what did the pharmacy advise?)  Preferred Pharmacy (with phone number or street name):  [id number] [place], [place] - [address] AT [facility] [address] [place]  [address] [place] [address]  Phone: [phone] Fax: [phone]  Hours: Not open 24 hours   Has the patient been seen for an appointment in the last year OR does the patient have an upcoming appointment? Yes.    Agent: Please be advised that RX refills may take up to 3 business days. We ask that you follow-up with your pharmacy.

## 2022-08-08 NOTE — Telephone Encounter (Signed)
Requested medications are due for refill today.  yes  Requested medications are on the active medications list.  yes  Last refill. 06/10/2020 #180 1OX  Future visit scheduled.   no  Notes to clinic.  Dr. Sullivan Lone signed rx. It has been a long time since this medication was prescribed. Please review for refill.    Requested Prescriptions  Pending Prescriptions Disp Refills   apixaban (ELIQUIS) 5 MG TABS tablet 180 tablet 0    Sig: Take 1 tablet (5 mg total) by mouth 2 (two) times daily.     Hematology:  Anticoagulants - apixaban Passed - 08/08/2022  9:20 AM      Passed - PLT in normal range and within 360 days    Platelets  Date Value Ref Range Status  01/05/2022 263 150 - 450 x10E3/uL Final         Passed - HGB in normal range and within 360 days    Hemoglobin  Date Value Ref Range Status  01/05/2022 14.9 13.0 - 17.7 g/dL Final         Passed - HCT in normal range and within 360 days    Hematocrit  Date Value Ref Range Status  01/05/2022 44.3 37.5 - 51.0 % Final         Passed - Cr in normal range and within 360 days    Creat  Date Value Ref Range Status  12/14/2016 1.02 0.70 - 1.25 mg/dL Final    Comment:    For patients >23 years of age, the reference limit for Creatinine is approximately 13% higher for people identified as African-American. .    Creatinine, Ser  Date Value Ref Range Status  01/05/2022 1.19 0.76 - 1.27 mg/dL Final         Passed - AST in normal range and within 360 days    AST  Date Value Ref Range Status  01/05/2022 11 0 - 40 IU/L Final         Passed - ALT in normal range and within 360 days    ALT  Date Value Ref Range Status  01/05/2022 27 0 - 44 IU/L Final         Passed - Valid encounter within last 12 months    Recent Outpatient Visits           4 months ago Right leg pain   [facility] Alfredia Ferguson, PA-C   7 months ago Annual physical exam   [facility] Caro Laroche, DO   1 year ago Encounter for [employer] annual wellness exam   [facility] Bosie Clos, MD   2 years ago Neuropathy   [facility] Bosie Clos, MD   3 years ago Neuropathy   [facility] Bosie Clos, MD

## 2022-08-08 NOTE — Telephone Encounter (Signed)
[facility] is requesting prescription refill ELIQUIS 5 MG TABS tablet  Please advise

## 2022-08-10 MED ORDER — APIXABAN 5 MG PO TABS: 5.0000 mg | ORAL_TABLET | Freq: Two times a day (BID) | ORAL | 1 refills | Status: AC

## 2022-08-29 DIAGNOSIS — M25551 Pain in right hip: Secondary | ICD-10-CM | POA: Diagnosis not present

## 2022-08-29 DIAGNOSIS — M5416 Radiculopathy, lumbar region: Secondary | ICD-10-CM | POA: Diagnosis not present

## 2022-08-29 DIAGNOSIS — M7061 Trochanteric bursitis, right hip: Secondary | ICD-10-CM | POA: Diagnosis not present

## 2022-09-02 DIAGNOSIS — M5416 Radiculopathy, lumbar region: Secondary | ICD-10-CM | POA: Diagnosis not present

## 2022-09-23 DIAGNOSIS — M5416 Radiculopathy, lumbar region: Secondary | ICD-10-CM | POA: Diagnosis not present

## 2022-10-06 DIAGNOSIS — K7689 Other specified diseases of liver: Secondary | ICD-10-CM | POA: Diagnosis not present

## 2022-10-06 DIAGNOSIS — I82532 Chronic embolism and thrombosis of left popliteal vein: Secondary | ICD-10-CM | POA: Diagnosis not present

## 2022-10-06 DIAGNOSIS — G4733 Obstructive sleep apnea (adult) (pediatric): Secondary | ICD-10-CM | POA: Diagnosis not present

## 2022-10-06 DIAGNOSIS — M5431 Sciatica, right side: Secondary | ICD-10-CM | POA: Diagnosis not present

## 2022-10-06 DIAGNOSIS — N281 Cyst of kidney, acquired: Secondary | ICD-10-CM | POA: Diagnosis not present

## 2022-10-06 DIAGNOSIS — Z23 Encounter for immunization: Secondary | ICD-10-CM | POA: Diagnosis not present

## 2022-11-03 ENCOUNTER — Telehealth: Payer: Self-pay | Admitting: Physician Assistant

## 2022-11-03 NOTE — Telephone Encounter (Signed)
[facility].

## 2022-12-14 DIAGNOSIS — Z85828 Personal history of other malignant neoplasm of skin: Secondary | ICD-10-CM | POA: Diagnosis not present

## 2022-12-14 DIAGNOSIS — Z86018 Personal history of other benign neoplasm: Secondary | ICD-10-CM | POA: Diagnosis not present

## 2022-12-14 DIAGNOSIS — L570 Actinic keratosis: Secondary | ICD-10-CM | POA: Diagnosis not present

## 2022-12-14 DIAGNOSIS — C44612 Basal cell carcinoma of skin of right upper limb, including shoulder: Secondary | ICD-10-CM | POA: Diagnosis not present

## 2022-12-14 DIAGNOSIS — Z859 Personal history of malignant neoplasm, unspecified: Secondary | ICD-10-CM | POA: Diagnosis not present

## 2022-12-14 DIAGNOSIS — L578 Other skin changes due to chronic exposure to nonionizing radiation: Secondary | ICD-10-CM | POA: Diagnosis not present

## 2022-12-14 DIAGNOSIS — L821 Other seborrheic keratosis: Secondary | ICD-10-CM | POA: Diagnosis not present

## 2022-12-14 DIAGNOSIS — D485 Neoplasm of uncertain behavior of skin: Secondary | ICD-10-CM | POA: Diagnosis not present

## 2022-12-14 DIAGNOSIS — Z872 Personal history of diseases of the skin and subcutaneous tissue: Secondary | ICD-10-CM | POA: Diagnosis not present

## 2023-01-12 DIAGNOSIS — C4491 Basal cell carcinoma of skin, unspecified: Secondary | ICD-10-CM | POA: Diagnosis not present

## 2023-01-12 DIAGNOSIS — C44612 Basal cell carcinoma of skin of right upper limb, including shoulder: Secondary | ICD-10-CM | POA: Diagnosis not present

## 2023-02-10 DIAGNOSIS — M25551 Pain in right hip: Secondary | ICD-10-CM | POA: Diagnosis not present

## 2023-02-10 DIAGNOSIS — M25552 Pain in left hip: Secondary | ICD-10-CM | POA: Diagnosis not present

## 2023-02-10 DIAGNOSIS — R739 Hyperglycemia, unspecified: Secondary | ICD-10-CM | POA: Diagnosis not present

## 2023-02-10 DIAGNOSIS — I82532 Chronic embolism and thrombosis of left popliteal vein: Secondary | ICD-10-CM | POA: Diagnosis not present

## 2023-02-10 DIAGNOSIS — G4733 Obstructive sleep apnea (adult) (pediatric): Secondary | ICD-10-CM | POA: Diagnosis not present

## 2023-02-10 DIAGNOSIS — Z0000 Encounter for general adult medical examination without abnormal findings: Secondary | ICD-10-CM | POA: Diagnosis not present

## 2023-02-10 DIAGNOSIS — E782 Mixed hyperlipidemia: Secondary | ICD-10-CM | POA: Diagnosis not present

## 2023-02-10 DIAGNOSIS — G629 Polyneuropathy, unspecified: Secondary | ICD-10-CM | POA: Diagnosis not present

## 2023-03-02 ENCOUNTER — Other Ambulatory Visit: Payer: Self-pay | Admitting: Physician Assistant

## 2023-06-14 DIAGNOSIS — M5431 Sciatica, right side: Secondary | ICD-10-CM | POA: Diagnosis not present

## 2023-08-21 DIAGNOSIS — M25551 Pain in right hip: Secondary | ICD-10-CM | POA: Diagnosis not present

## 2023-08-22 ENCOUNTER — Ambulatory Visit
Admission: RE | Admit: 2023-08-22 | Discharge: 2023-08-22 | Disposition: A | Discharge status: Home / Self Care | Source: Ambulatory Visit | Attending: Orthopaedic Surgery | Admitting: Orthopaedic Surgery

## 2023-08-22 ENCOUNTER — Other Ambulatory Visit: Payer: Self-pay | Admitting: Orthopaedic Surgery

## 2023-08-22 DIAGNOSIS — M1611 Unilateral primary osteoarthritis, right hip: Secondary | ICD-10-CM | POA: Diagnosis not present

## 2023-08-22 DIAGNOSIS — M25551 Pain in right hip: Secondary | ICD-10-CM | POA: Insufficient documentation

## 2023-08-22 DIAGNOSIS — M24151 Other articular cartilage disorders, right hip: Secondary | ICD-10-CM | POA: Diagnosis not present

## 2023-08-25 DIAGNOSIS — M1611 Unilateral primary osteoarthritis, right hip: Secondary | ICD-10-CM | POA: Diagnosis not present

## 2023-09-19 DIAGNOSIS — L578 Other skin changes due to chronic exposure to nonionizing radiation: Secondary | ICD-10-CM | POA: Diagnosis not present

## 2023-09-19 DIAGNOSIS — Z85828 Personal history of other malignant neoplasm of skin: Secondary | ICD-10-CM | POA: Diagnosis not present

## 2023-09-19 DIAGNOSIS — Z859 Personal history of malignant neoplasm, unspecified: Secondary | ICD-10-CM | POA: Diagnosis not present

## 2023-09-19 DIAGNOSIS — Z86018 Personal history of other benign neoplasm: Secondary | ICD-10-CM | POA: Diagnosis not present

## 2023-09-19 DIAGNOSIS — D485 Neoplasm of uncertain behavior of skin: Secondary | ICD-10-CM | POA: Diagnosis not present

## 2023-09-19 DIAGNOSIS — D0359 Melanoma in situ of other part of trunk: Secondary | ICD-10-CM | POA: Diagnosis not present

## 2023-09-19 DIAGNOSIS — Z872 Personal history of diseases of the skin and subcutaneous tissue: Secondary | ICD-10-CM | POA: Diagnosis not present

## 2023-09-19 DIAGNOSIS — L570 Actinic keratosis: Secondary | ICD-10-CM | POA: Diagnosis not present

## 2023-09-21 DIAGNOSIS — M5431 Sciatica, right side: Secondary | ICD-10-CM | POA: Diagnosis not present

## 2023-09-21 DIAGNOSIS — J301 Allergic rhinitis due to pollen: Secondary | ICD-10-CM | POA: Diagnosis not present

## 2023-09-21 DIAGNOSIS — M1611 Unilateral primary osteoarthritis, right hip: Secondary | ICD-10-CM | POA: Diagnosis not present

## 2023-09-21 DIAGNOSIS — R739 Hyperglycemia, unspecified: Secondary | ICD-10-CM | POA: Diagnosis not present

## 2023-10-03 DIAGNOSIS — M25551 Pain in right hip: Secondary | ICD-10-CM | POA: Diagnosis not present

## 2023-10-03 DIAGNOSIS — M5410 Radiculopathy, site unspecified: Secondary | ICD-10-CM | POA: Diagnosis not present

## 2023-10-11 DIAGNOSIS — M5416 Radiculopathy, lumbar region: Secondary | ICD-10-CM | POA: Diagnosis not present

## 2023-10-11 DIAGNOSIS — G8929 Other chronic pain: Secondary | ICD-10-CM | POA: Diagnosis not present

## 2023-10-11 DIAGNOSIS — M5441 Lumbago with sciatica, right side: Secondary | ICD-10-CM | POA: Diagnosis not present

## 2023-10-12 DIAGNOSIS — R7303 Prediabetes: Secondary | ICD-10-CM | POA: Diagnosis not present

## 2023-10-12 DIAGNOSIS — M5416 Radiculopathy, lumbar region: Secondary | ICD-10-CM | POA: Diagnosis not present

## 2023-10-18 DIAGNOSIS — C4359 Malignant melanoma of other part of trunk: Secondary | ICD-10-CM | POA: Diagnosis not present

## 2023-10-18 DIAGNOSIS — D225 Melanocytic nevi of trunk: Secondary | ICD-10-CM | POA: Diagnosis not present

## 2023-10-25 DIAGNOSIS — M5441 Lumbago with sciatica, right side: Secondary | ICD-10-CM | POA: Diagnosis not present

## 2023-10-25 DIAGNOSIS — G8929 Other chronic pain: Secondary | ICD-10-CM | POA: Diagnosis not present

## 2023-10-25 DIAGNOSIS — M5416 Radiculopathy, lumbar region: Secondary | ICD-10-CM | POA: Diagnosis not present

## 2023-11-01 DIAGNOSIS — L905 Scar conditions and fibrosis of skin: Secondary | ICD-10-CM | POA: Diagnosis not present

## 2023-11-01 DIAGNOSIS — L821 Other seborrheic keratosis: Secondary | ICD-10-CM | POA: Diagnosis not present

## 2023-11-01 DIAGNOSIS — Z85820 Personal history of malignant melanoma of skin: Secondary | ICD-10-CM | POA: Diagnosis not present

## 2023-11-01 DIAGNOSIS — L570 Actinic keratosis: Secondary | ICD-10-CM | POA: Diagnosis not present

## 2023-11-16 DIAGNOSIS — R7303 Prediabetes: Secondary | ICD-10-CM | POA: Diagnosis not present

## 2023-11-16 DIAGNOSIS — M5416 Radiculopathy, lumbar region: Secondary | ICD-10-CM | POA: Diagnosis not present
